# Patient Record
Sex: Female | Born: 1970 | Race: White | Hispanic: No | Marital: Married | Smoking: Former smoker
Health system: Southern US, Community
[De-identification: ages and names within clinical notes are randomized; demographics above are authoritative.]

## PROBLEM LIST (undated history)

## (undated) DIAGNOSIS — E785 Hyperlipidemia, unspecified: Secondary | ICD-10-CM

## (undated) DIAGNOSIS — F411 Generalized anxiety disorder: Secondary | ICD-10-CM

## (undated) DIAGNOSIS — K219 Gastro-esophageal reflux disease without esophagitis: Secondary | ICD-10-CM

## (undated) DIAGNOSIS — I10 Essential (primary) hypertension: Secondary | ICD-10-CM

## (undated) DIAGNOSIS — F410 Panic disorder [episodic paroxysmal anxiety] without agoraphobia: Secondary | ICD-10-CM

## (undated) HISTORY — PX: OTHER SURGICAL HISTORY: SHX169

## (undated) HISTORY — PX: TUBAL LIGATION: SHX77

## (undated) HISTORY — DX: Hyperlipidemia, unspecified: E78.5

## (undated) HISTORY — DX: Essential (primary) hypertension: I10

## (undated) HISTORY — DX: Gastro-esophageal reflux disease without esophagitis: K21.9

## (undated) HISTORY — DX: Generalized anxiety disorder: F41.1

## (undated) HISTORY — DX: Panic disorder (episodic paroxysmal anxiety): F41.0

---

## 2001-01-14 ENCOUNTER — Other Ambulatory Visit: Admission: RE | Admit: 2001-01-14 | Discharge: 2001-01-14 | Payer: Self-pay | Admitting: Obstetrics and Gynecology

## 2001-05-27 ENCOUNTER — Encounter: Payer: Self-pay | Admitting: Obstetrics and Gynecology

## 2001-05-27 ENCOUNTER — Inpatient Hospital Stay (HOSPITAL_COMMUNITY): Admission: AD | Admit: 2001-05-27 | Discharge: 2001-05-27 | Payer: Self-pay | Admitting: Obstetrics and Gynecology

## 2001-07-11 ENCOUNTER — Encounter (INDEPENDENT_AMBULATORY_CARE_PROVIDER_SITE_OTHER): Payer: Self-pay | Admitting: Specialist

## 2001-07-11 ENCOUNTER — Inpatient Hospital Stay (HOSPITAL_COMMUNITY): Admission: AD | Admit: 2001-07-11 | Discharge: 2001-07-13 | Payer: Self-pay | Admitting: Obstetrics and Gynecology

## 2001-07-14 ENCOUNTER — Encounter: Admission: RE | Admit: 2001-07-14 | Discharge: 2001-08-13 | Payer: Self-pay | Admitting: Obstetrics and Gynecology

## 2001-07-15 ENCOUNTER — Inpatient Hospital Stay (HOSPITAL_COMMUNITY): Admission: AD | Admit: 2001-07-15 | Discharge: 2001-07-15 | Payer: Self-pay | Admitting: Obstetrics & Gynecology

## 2001-08-18 ENCOUNTER — Other Ambulatory Visit: Admission: RE | Admit: 2001-08-18 | Discharge: 2001-08-18 | Payer: Self-pay | Admitting: Obstetrics and Gynecology

## 2004-09-09 ENCOUNTER — Other Ambulatory Visit: Admission: RE | Admit: 2004-09-09 | Discharge: 2004-09-09 | Payer: Self-pay | Admitting: Obstetrics and Gynecology

## 2005-09-11 ENCOUNTER — Other Ambulatory Visit: Admission: RE | Admit: 2005-09-11 | Discharge: 2005-09-11 | Payer: Self-pay | Admitting: Obstetrics and Gynecology

## 2006-08-11 ENCOUNTER — Other Ambulatory Visit: Admission: RE | Admit: 2006-08-11 | Discharge: 2006-08-11 | Payer: Self-pay | Admitting: Obstetrics and Gynecology

## 2007-08-16 ENCOUNTER — Other Ambulatory Visit: Admission: RE | Admit: 2007-08-16 | Discharge: 2007-08-16 | Payer: Self-pay | Admitting: Obstetrics and Gynecology

## 2008-08-30 ENCOUNTER — Other Ambulatory Visit: Admission: RE | Admit: 2008-08-30 | Discharge: 2008-08-30 | Payer: Self-pay | Admitting: Obstetrics and Gynecology

## 2009-08-31 ENCOUNTER — Other Ambulatory Visit: Admission: RE | Admit: 2009-08-31 | Discharge: 2009-08-31 | Payer: Self-pay | Admitting: Obstetrics and Gynecology

## 2010-09-23 ENCOUNTER — Other Ambulatory Visit: Admission: RE | Admit: 2010-09-23 | Discharge: 2010-09-23 | Payer: Self-pay | Admitting: Obstetrics and Gynecology

## 2011-01-16 ENCOUNTER — Other Ambulatory Visit: Payer: Self-pay | Admitting: Family Medicine

## 2011-01-16 ENCOUNTER — Ambulatory Visit
Admission: RE | Admit: 2011-01-16 | Discharge: 2011-01-16 | Disposition: A | Discharge status: Home / Self Care | Payer: BC Managed Care – PPO | Source: Ambulatory Visit | Attending: Family Medicine | Admitting: Family Medicine

## 2011-04-11 NOTE — Op Note (Signed)
[facility]  PatientMOLLYE, Kathryn Mitchell Visit Number: [id number] MRN: [id number]          Service Type: OBS Location: 910A 9110 01 Attending Physician:  Frederich Balding Proc. Date: 07/11/01 Adm. Date:  [id number]                             Operative Report  DELIVERY NOTE  HISTORY:                      The patient presented on August 18 with an intrauterine pregnancy at 35-1/2 weeks and spontaneous rupture of membranes. After discussion, we decided to proceed with induction in view of advanced gestational age and premature rupture of membranes with our concern being the risk of infection. We did not know her group B strep status. She was begun on IV penicillin G per protocol. With labor, the infant demonstrated deep variable decelerations. We felt this was probably due to decreased amniotic fluid. We therefore began an amnioinfusion. With this, we had an improvement in the fetal heart rate pattern. She eventually became completely dilated. With the second stage, recurrent decelerations occurred. The fetal vertex was at a +2 to +3 station in the ROA position. Due to the worsened decelerations, the decision was made to proceed with a vacuum assisted extraction delivery. We had discussed the risks of this with the parents including the risk of subcutaneous hemorrhage that could require transfusion, the risk of intracranial hemorrhage and its complications.  DESCRIPTION OF PROCEDURE:     The patient was in the dorsal lithotomy position. The Tender Touch vacuum extractor was put in place. With the next contraction, appropriate suction was undertaken. The fetal vertex was brought down to about crowning. We then removed it. We allowed the patient to begin pushing and the fetal vertex did not descend any further and we had a heart rate of this point and time in the 70s. The vacuum extractor was put back in place. With the next contraction, the fetal  vertex was brought beyond crowning, removed, and the patient went on to have a spontaneous vaginal delivery of viable female who weighed 4 pounds 5 ounces. Apgars were 8/8. The pH was 7.18. The team was in attendance due to prematurity. There was no episiotomy but there was a second-degree perineal tear that was repaired with 2-0 chromic. The placenta was delivered intact and sent to pathology. It did appear to be small and the infant appeared slightly growth retarded.  ANESTHESIA:                   Epidural.  ESTIMATED BLOOD LOSS:         800 cc.  DISPOSITION:                  Mother and baby were doing well in the postpartum period. DD:  07/11/01 TD:  07/12/01 Job: [id number]

## 2011-11-04 ENCOUNTER — Other Ambulatory Visit: Payer: Self-pay | Admitting: Obstetrics and Gynecology

## 2012-09-16 IMAGING — CT CT ABD-PELV W/O CM
3 of 4 series · 13 of 42 positions shown, 19 images · non-contrast
Comparison: None

***ADDENDUM*** CREATED: 02/10/2011 [DATE]

I reviewed the CT scan per Dr. Ezequi request.  Patient continues
with colicky pain.  The calcification in the left pelvis I
originally believed was just posterior to the left ureter but after
reviewing the study and confering with colleges believe that  it is
likely in the distal left ureter.
CLINICAL DATA: Hematuria and abdominal pain.
CT ABDOMEN AND PELVIS WITHOUT CONTRAST
TECHNIQUE: Multidetector CT imaging of the abdomen and pelvis was
performed following the standard protocol without intravenous
contrast.

[Series 2: renal stone w/o · axial · non-contrast · 0.81mm/px · z∈[-371,-36]mm · 8 of 87 slices shown, 13 images]
[im 10/87  soft-tissue]
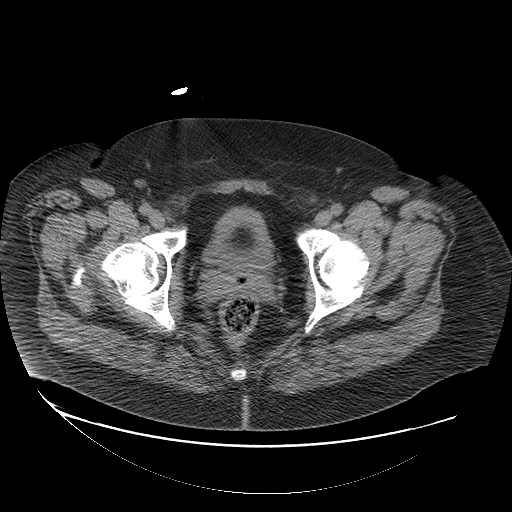
[im 10/87  bone]
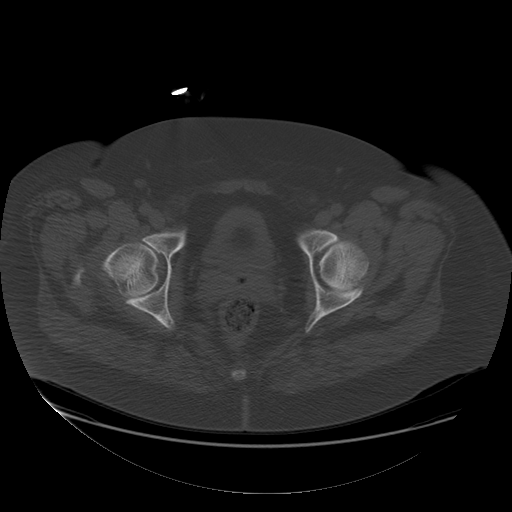
[im 20/87  soft-tissue]
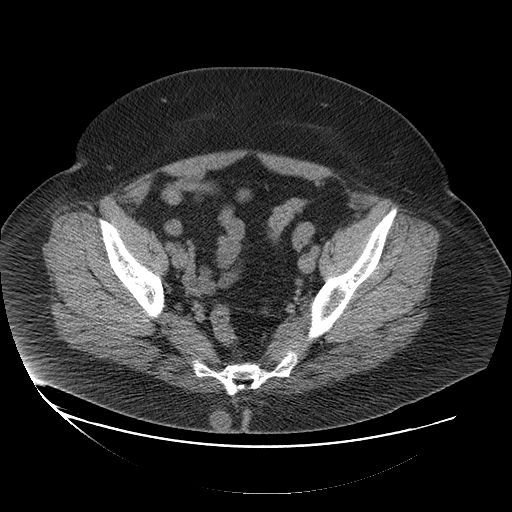
[im 29/87  soft-tissue]
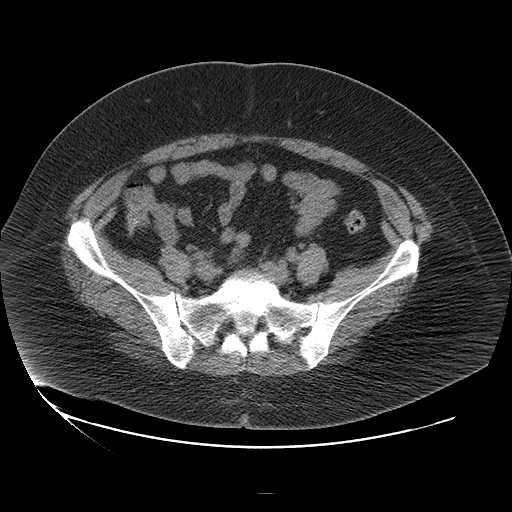
[im 39/87  soft-tissue]
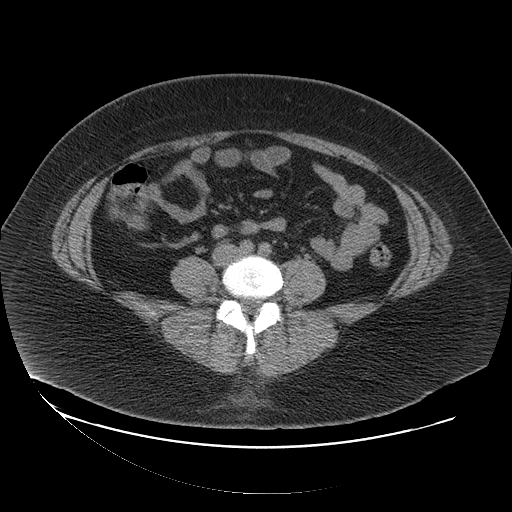
[im 48/87  soft-tissue]
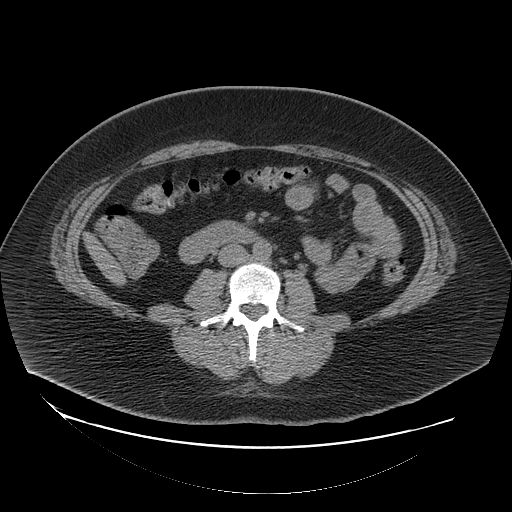
[im 48/87  lung]
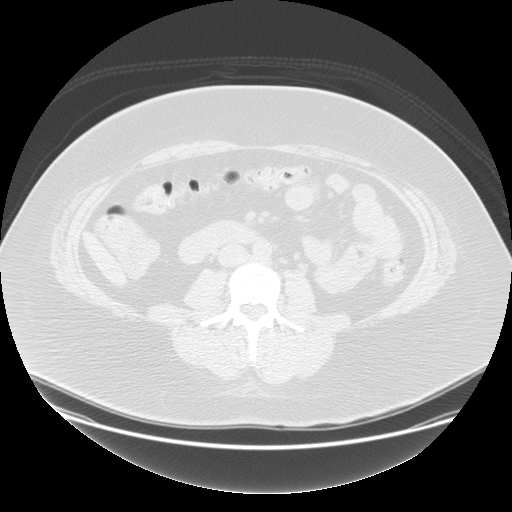
[im 58/87  soft-tissue]
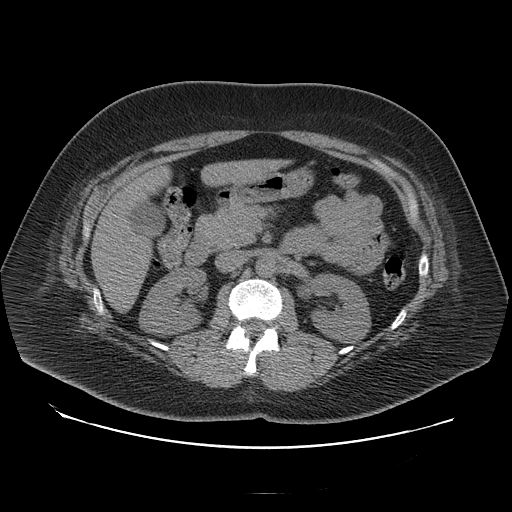
[im 58/87  lung]
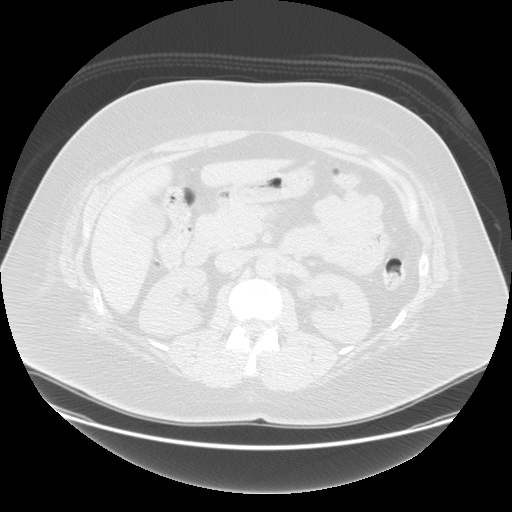
[im 67/87  soft-tissue]
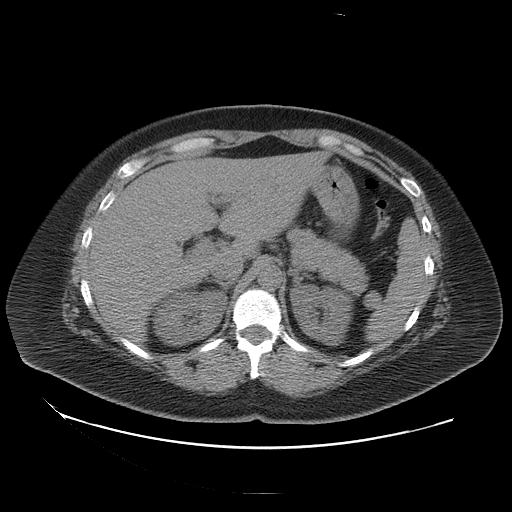
[im 67/87  lung]
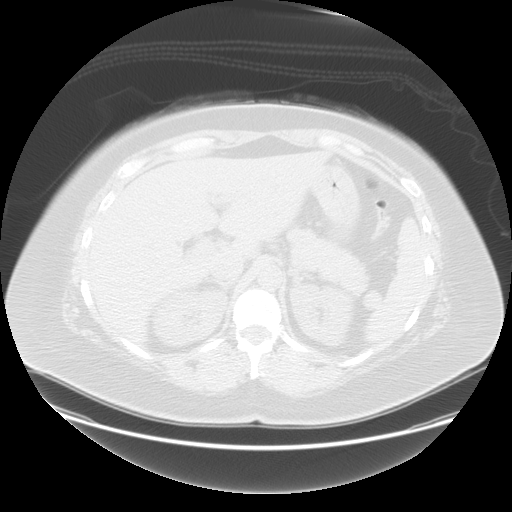
[im 77/87  soft-tissue]
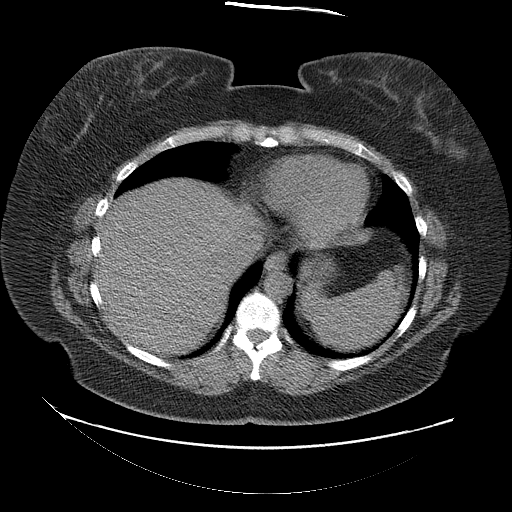
[im 77/87  lung]
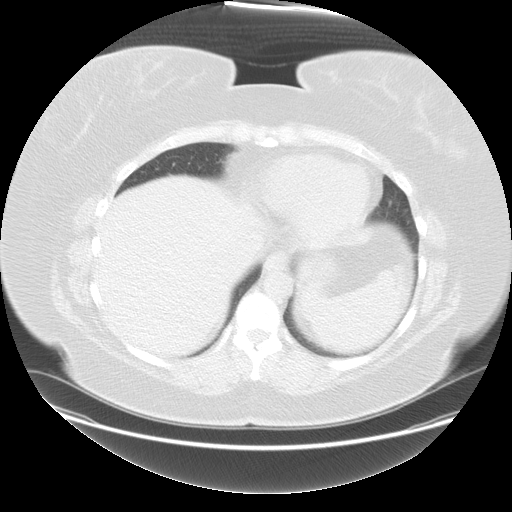

[Series 400: coronals · coronal · 0.90mm/px · 3 of 129 slices shown, 4 images]
[im 43/129  soft-tissue]
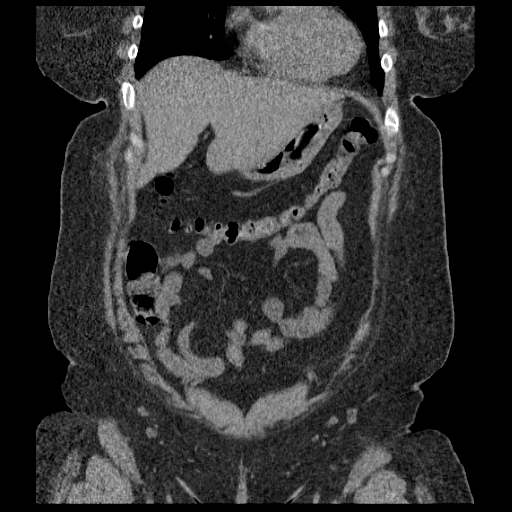
[im 57/129  soft-tissue]
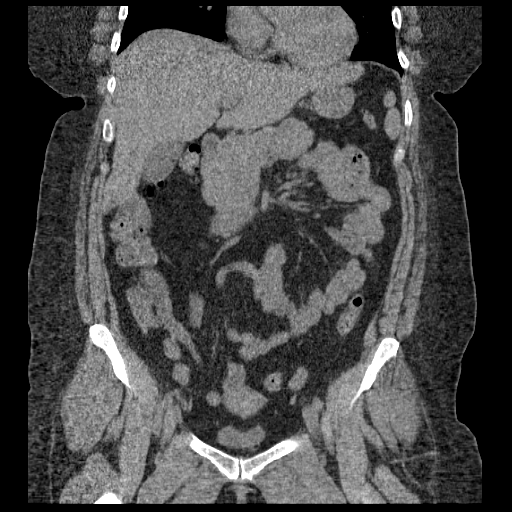
[im 57/129  bone]
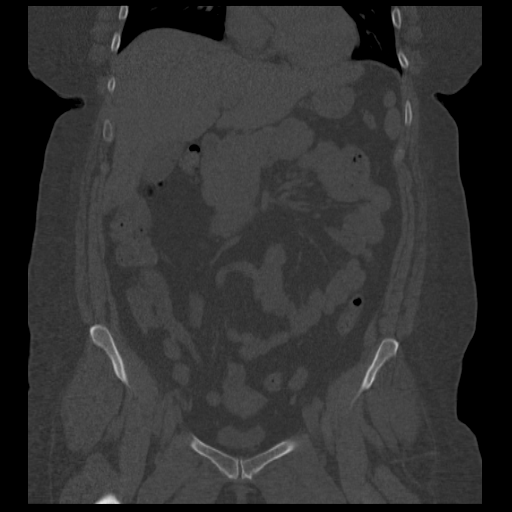
[im 72/129  soft-tissue]
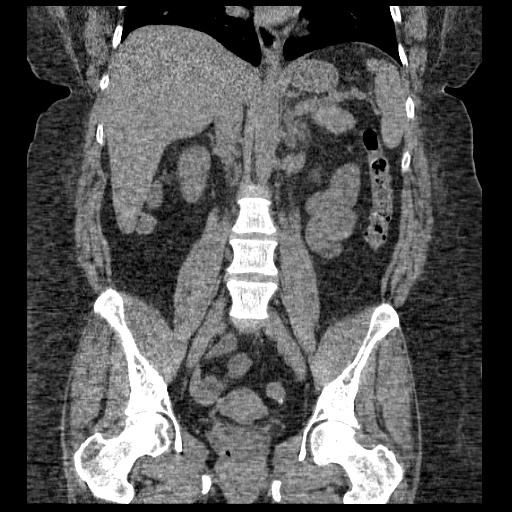

[Series 401: sagittals · sagittal · 0.90mm/px · 2 of 161 slices shown]
[im 18/161  soft-tissue]
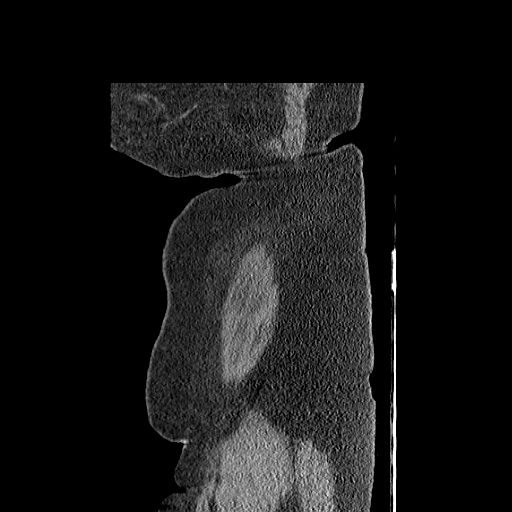
[im 36/161  soft-tissue]
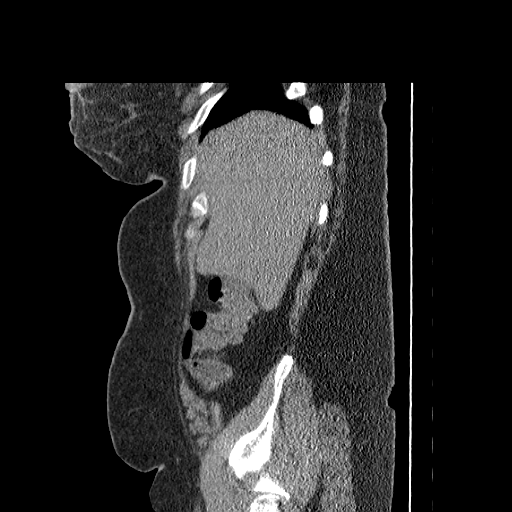

[13 of 42 positions shown; findings below may reference images not displayed]

FINDINGS: The lung bases are clear.  No pleural effusion.

The unenhanced appearance of the liver is unremarkable.  No focal
lesions or biliary dilatation.  The gallbladder appears normal.  No
common bile duct dilatation.  The pancreas is grossly normal.  The
spleen is normal in size.  No focal lesions.

The adrenal glands and kidneys are unremarkable.  No renal or
obstructing ureteral calculi.  No hydronephrosis or renal masses.
No perinephric process.

The stomach, duodenum, small bowel and colon demonstrate no
significant abnormalities.  No inflammatory changes or mass
lesions.  Scattered colonic diverticulosis is noted.  The appendix
is normal.  Small radiodensities are scattered in the colon and
likely represent ingested material. No mesenteric or
retroperitoneal masses or adenopathy.  The aorta is normal in
caliber.  No atherosclerotic changes.

The uterus and ovaries are normal.  The bladder is unremarkable.
No pelvic mass or adenopathy.  No free pelvic fluid collections.
No inguinal mass or hernia.  A sebaceous cyst is noted in the right
buttock area.

The bony pelvis is intact.  The lumbar vertebral bodies are
normally aligned.
IMPRESSION: No acute abdominal/pelvic findings, mass lesions or adenopathy.  No
renal or obstructing ureteral calculi.

## 2013-12-14 ENCOUNTER — Telehealth: Payer: Self-pay | Admitting: Nurse Practitioner

## 2013-12-14 ENCOUNTER — Encounter: Payer: Self-pay | Admitting: General Practice

## 2013-12-14 ENCOUNTER — Ambulatory Visit (INDEPENDENT_AMBULATORY_CARE_PROVIDER_SITE_OTHER): Payer: BC Managed Care – PPO | Admitting: General Practice

## 2013-12-14 VITALS — BP 171/94 | HR 74 | Temp 98.0°F | Ht 64.0 in | Wt 212.0 lb

## 2013-12-14 DIAGNOSIS — R05 Cough: Secondary | ICD-10-CM

## 2013-12-14 DIAGNOSIS — R059 Cough, unspecified: Secondary | ICD-10-CM

## 2013-12-14 DIAGNOSIS — J0100 Acute maxillary sinusitis, unspecified: Secondary | ICD-10-CM

## 2013-12-14 MED ORDER — BENZONATATE 100 MG PO CAPS: 100.0000 mg | ORAL_CAPSULE | Freq: Three times a day (TID) | ORAL | Status: DC | PRN

## 2013-12-14 MED ORDER — AZITHROMYCIN 250 MG PO TABS: ORAL_TABLET | ORAL | Status: DC

## 2013-12-14 NOTE — Telephone Encounter (Signed)
Appt given for today 

## 2013-12-14 NOTE — Progress Notes (Signed)
   Subjective:    Patient ID: Kathryn BraunPatti L Oertel, female    DOB: Oct 17, 1971, 43 y.o.   MRN: [id number]  Cough This is a new problem. The current episode started in the past 7 days. The problem has been gradually worsening. The problem occurs hourly. The cough is non-productive. Associated symptoms include postnasal drip. Pertinent negatives include no chest pain, chills, fever, headaches, myalgias, nasal congestion, sore throat, shortness of breath or wheezing. The symptoms are aggravated by lying down. She has tried OTC cough suppressant for the symptoms. There is no history of asthma, bronchitis or pneumonia.      Review of Systems  Constitutional: Negative for fever and chills.  HENT: Positive for postnasal drip and sinus pressure. Negative for sore throat.   Respiratory: Positive for cough. Negative for chest tightness, shortness of breath and wheezing.   Cardiovascular: Negative for chest pain and palpitations.  Musculoskeletal: Negative for myalgias.  Neurological: Negative for dizziness, weakness and headaches.       Objective:   Physical Exam  Constitutional: She is oriented to person, place, and time. She appears well-developed and well-nourished.  Cardiovascular: Normal rate, regular rhythm and normal heart sounds.   Pulmonary/Chest: Effort normal and breath sounds normal. No respiratory distress. She exhibits no tenderness.  Neurological: She is alert and oriented to person, place, and time.  Skin: Skin is warm and dry.  Psychiatric: She has a normal mood and affect.          Assessment & Plan:  1. Sinusitis, acute maxillary  - azithromycin (ZITHROMAX) 250 MG tablet; Take as directed  Dispense: 6 tablet; Refill: 0  2. Cough  - benzonatate (TESSALON) 100 MG capsule; Take 1 capsule (100 mg total) by mouth 3 (three) times daily as needed.  Dispense: 30 capsule; Refill: 0 -adequate fluids -RTO if symptoms worsen or unresolved -Patient verbalized understanding Coralie KeensMae E.  Zollie Ellery, FNP-C

## 2013-12-14 NOTE — Patient Instructions (Signed)

## 2014-01-04 ENCOUNTER — Ambulatory Visit (INDEPENDENT_AMBULATORY_CARE_PROVIDER_SITE_OTHER): Payer: Self-pay | Admitting: Surgery

## 2014-09-06 ENCOUNTER — Telehealth: Payer: Self-pay | Admitting: General Practice

## 2014-09-06 NOTE — Telephone Encounter (Signed)
Appt given per patient request 

## 2014-09-07 ENCOUNTER — Ambulatory Visit (INDEPENDENT_AMBULATORY_CARE_PROVIDER_SITE_OTHER): Payer: BC Managed Care – PPO | Admitting: Family Medicine

## 2014-09-07 ENCOUNTER — Encounter: Payer: Self-pay | Admitting: Family Medicine

## 2014-09-07 ENCOUNTER — Ambulatory Visit: Payer: BC Managed Care – PPO | Admitting: Family Medicine

## 2014-09-07 ENCOUNTER — Encounter (INDEPENDENT_AMBULATORY_CARE_PROVIDER_SITE_OTHER): Payer: Self-pay

## 2014-09-07 VITALS — BP 139/84 | HR 71 | Temp 97.9°F | Ht 64.0 in | Wt 211.2 lb

## 2014-09-07 DIAGNOSIS — K2100 Gastro-esophageal reflux disease with esophagitis, without bleeding: Secondary | ICD-10-CM

## 2014-09-07 DIAGNOSIS — R5383 Other fatigue: Secondary | ICD-10-CM

## 2014-09-07 DIAGNOSIS — R002 Palpitations: Secondary | ICD-10-CM

## 2014-09-07 DIAGNOSIS — R0602 Shortness of breath: Secondary | ICD-10-CM

## 2014-09-07 DIAGNOSIS — F411 Generalized anxiety disorder: Secondary | ICD-10-CM

## 2014-09-07 LAB — POCT CBC
Granulocyte percent: 66.8 %G (ref 37–80)
HCT, POC: 42.2 % (ref 37.7–47.9)
Hemoglobin: 13.8 g/dL (ref 12.2–16.2)
Lymph, poc: 2.4 (ref 0.6–3.4)
MCH, POC: 28.5 pg (ref 27–31.2)
MCHC: 32.6 g/dL (ref 31.8–35.4)
MCV: 87.5 fL (ref 80–97)
MPV: 7.4 fL (ref 0–99.8)
POC Granulocyte: 5.7 (ref 2–6.9)
POC LYMPH PERCENT: 28.6 %L (ref 10–50)
Platelet Count, POC: 278 10*3/uL (ref 142–424)
RBC: 4.8 M/uL (ref 4.04–5.48)
RDW, POC: 13.8 %
WBC: 8.5 10*3/uL (ref 4.6–10.2)

## 2014-09-07 MED ORDER — OMEPRAZOLE 20 MG PO CPDR: 20.0000 mg | DELAYED_RELEASE_CAPSULE | Freq: Every day | ORAL | Status: DC

## 2014-09-07 MED ORDER — SERTRALINE HCL 50 MG PO TABS: 50.0000 mg | ORAL_TABLET | Freq: Every day | ORAL | Status: DC

## 2014-09-07 NOTE — Progress Notes (Signed)
   Subjective:    Patient ID: Kathryn Mitchell, female    DOB: 09/08/1971, 43 y.o.   MRN: [id number]  HPI This 43 y.o. female presents for evaluation of sob and heart palpitation that last for 10 seconds then go away.  She is not currently having any episodes.  She had this happen a lot yesterday.  She drinks a lot of sun drops.   Review of Systems    No chest pain, SOB, HA, dizziness, vision change, N/V, diarrhea, constipation, dysuria, urinary urgency or frequency, myalgias, arthralgias or rash.  Objective:   Physical Exam  Vital signs noted  Well developed well nourished female.  HEENT - Head atraumatic Normocephalic                Eyes - PERRLA, Conjuctiva - clear Sclera- Clear EOMI                Ears - EAC's Wnl TM's Wnl Gross Hearing WNL                Nose - Nares patent                 Throat - oropharanx wnl Respiratory - Lungs CTA bilateral Cardiac - RRR S1 and S2 without murmur GI - Abdomen soft Nontender and bowel sounds active x 4 Extremities - No edema. Neuro - Grossly intact.  EKG - NSR w/o acute t wave changes     Assessment & Plan:  Palpitations - Plan: EKG 12-Lead, POCT CBC, CMP14+EGFR, Lipid panel, Thyroid Panel With TSH, Holter monitor - 24 hour, Holter monitor - 24 hour  SOB (shortness of breath) - Plan: POCT CBC  Other fatigue - Plan: POCT CBC, Thyroid Panel With TSH  Generalized anxiety disorder - Plan: sertraline (ZOLOFT) 50 MG tablet  Gastroesophageal reflux disease with esophagitis - Plan: omeprazole (PRILOSEC) 20 MG capsule  William J Oxford FNP 

## 2014-09-08 ENCOUNTER — Ambulatory Visit: Payer: BC Managed Care – PPO | Admitting: Family Medicine

## 2014-09-08 LAB — LIPID PANEL
Chol/HDL Ratio: 4.4 ratio units (ref 0.0–4.4)
Cholesterol, Total: 225 mg/dL — ABNORMAL HIGH (ref 100–199)
HDL: 51 mg/dL (ref >39)
LDL Calculated: 147 mg/dL — ABNORMAL HIGH (ref 0–99)
Triglycerides: 135 mg/dL (ref 0–149)
VLDL Cholesterol Cal: 27 mg/dL (ref 5–40)

## 2014-09-08 LAB — CMP14+EGFR
ALT: 17 IU/L (ref 0–32)
AST: 18 IU/L (ref 0–40)
Albumin/Globulin Ratio: 1.6 (ref 1.1–2.5)
Albumin: 4 g/dL (ref 3.5–5.5)
Alkaline Phosphatase: 105 IU/L (ref 39–117)
BUN/Creatinine Ratio: 9 (ref 9–23)
BUN: 7 mg/dL (ref 6–24)
CO2: 24 mmol/L (ref 18–29)
Calcium: 8.9 mg/dL (ref 8.7–10.2)
Chloride: 101 mmol/L (ref 97–108)
Creatinine, Ser: 0.75 mg/dL (ref 0.57–1.00)
GFR calc Af Amer: 112 mL/min/{1.73_m2} (ref >59)
GFR calc non Af Amer: 97 mL/min/{1.73_m2} (ref >59)
Globulin, Total: 2.5 g/dL (ref 1.5–4.5)
Glucose: 83 mg/dL (ref 65–99)
Potassium: 4.3 mmol/L (ref 3.5–5.2)
Sodium: 139 mmol/L (ref 134–144)
Total Bilirubin: 0.3 mg/dL (ref 0.0–1.2)
Total Protein: 6.5 g/dL (ref 6.0–8.5)

## 2014-09-08 LAB — THYROID PANEL WITH TSH
Free Thyroxine Index: 2.3 (ref 1.2–4.9)
T3 Uptake Ratio: 24 % (ref 24–39)
T4, Total: 9.7 ug/dL (ref 4.5–12.0)
TSH: 2.1 u[IU]/mL (ref 0.450–4.500)

## 2014-09-14 ENCOUNTER — Telehealth: Payer: Self-pay | Admitting: Family Medicine

## 2014-09-14 NOTE — Telephone Encounter (Signed)
Pt aware that results are still pending and we will give her a call when done

## 2014-09-15 ENCOUNTER — Other Ambulatory Visit: Payer: Self-pay | Admitting: Family Medicine

## 2014-09-15 MED ORDER — PRAVASTATIN SODIUM 20 MG PO TABS: 20.0000 mg | ORAL_TABLET | Freq: Every day | ORAL | Status: DC

## 2014-09-15 NOTE — Progress Notes (Signed)
Pt aware of labs & Rx is at pharmacy. Will call her when we get her holter results, they are still pending

## 2014-09-26 ENCOUNTER — Telehealth: Payer: Self-pay | Admitting: *Deleted

## 2014-09-26 NOTE — Telephone Encounter (Signed)
Pt notified of Holter results Verbalizes understanding

## 2014-09-27 NOTE — Telephone Encounter (Signed)
Results scanned into epic

## 2014-10-18 ENCOUNTER — Telehealth: Payer: Self-pay | Admitting: *Deleted

## 2014-10-18 MED ORDER — BENZONATATE 100 MG PO CAPS: 100.0000 mg | ORAL_CAPSULE | Freq: Three times a day (TID) | ORAL | Status: DC | PRN

## 2014-10-18 NOTE — Telephone Encounter (Signed)
Pt wants Tessalon pearles  For cough and sinus, she is unable to get appt with us and doesn't want to go to ER. [facility]

## 2014-10-25 ENCOUNTER — Other Ambulatory Visit: Payer: Self-pay | Admitting: *Deleted

## 2014-10-25 DIAGNOSIS — K2100 Gastro-esophageal reflux disease with esophagitis, without bleeding: Secondary | ICD-10-CM

## 2014-10-25 MED ORDER — OMEPRAZOLE 20 MG PO CPDR: 20.0000 mg | DELAYED_RELEASE_CAPSULE | Freq: Every day | ORAL | Status: DC

## 2014-12-19 ENCOUNTER — Ambulatory Visit (INDEPENDENT_AMBULATORY_CARE_PROVIDER_SITE_OTHER): Payer: BLUE CROSS/BLUE SHIELD | Admitting: Family Medicine

## 2014-12-19 ENCOUNTER — Encounter: Payer: Self-pay | Admitting: Family Medicine

## 2014-12-19 VITALS — BP 140/88 | HR 83 | Temp 98.6°F | Ht 64.0 in | Wt 219.0 lb

## 2014-12-19 DIAGNOSIS — J206 Acute bronchitis due to rhinovirus: Secondary | ICD-10-CM

## 2014-12-19 MED ORDER — FLUCONAZOLE 150 MG PO TABS: 150.0000 mg | ORAL_TABLET | Freq: Once | ORAL | Status: DC

## 2014-12-19 MED ORDER — AMOXICILLIN 875 MG PO TABS: 875.0000 mg | ORAL_TABLET | Freq: Two times a day (BID) | ORAL | Status: DC

## 2014-12-19 MED ORDER — METHYLPREDNISOLONE ACETATE 80 MG/ML IJ SUSP
80.0000 mg | Freq: Once | INTRAMUSCULAR | Status: AC
  Administered 2014-12-19: 80 mg via INTRAMUSCULAR

## 2014-12-19 MED ORDER — LIDOCAINE VISCOUS 2 % MT SOLN: 20.0000 mL | OROMUCOSAL | Status: DC | PRN

## 2014-12-19 MED ORDER — HYDROCODONE-HOMATROPINE 5-1.5 MG/5ML PO SYRP: 5.0000 mL | ORAL_SOLUTION | Freq: Three times a day (TID) | ORAL | Status: DC | PRN

## 2014-12-19 NOTE — Progress Notes (Signed)
   Subjective:    Patient ID: Kathryn Mitchell, female    DOB: [birth year], 44 y.o.   MRN: [id number]  HPI Patient is here for c/o severe cough  Review of Systems  Constitutional: Negative for fever.  HENT: Negative for ear pain.   Eyes: Negative for discharge.  Respiratory: Negative for cough.   Cardiovascular: Negative for chest pain.  Gastrointestinal: Negative for abdominal distention.  Endocrine: Negative for polyuria.  Genitourinary: Negative for difficulty urinating.  Musculoskeletal: Negative for gait problem and neck pain.  Skin: Negative for color change and rash.  Neurological: Negative for speech difficulty and headaches.  Psychiatric/Behavioral: Negative for agitation.       Objective:    BP 140/88 mmHg  Pulse 83  Temp(Src) 98.6 F (37 C) (Oral)  Ht 5\' 4"  (1.626 m)  Wt 219 lb (99.338 kg)  BMI 37.57 kg/m2 Physical Exam  Constitutional: She is oriented to person, place, and time. She appears well-developed and well-nourished.  HENT:  Head: Normocephalic and atraumatic.  Mouth/Throat: Oropharynx is clear and moist.  Eyes: Pupils are equal, round, and reactive to light.  Neck: Normal range of motion. Neck supple.  Cardiovascular: Normal rate and regular rhythm.   No murmur heard. Pulmonary/Chest: Effort normal and breath sounds normal.  Abdominal: Soft. Bowel sounds are normal. There is no tenderness.  Neurological: She is alert and oriented to person, place, and time.  Skin: Skin is warm and dry.  Psychiatric: She has a normal mood and affect.          Assessment & Plan:     ICD-9-CM ICD-10-CM   1. Acute bronchitis due to Rhinovirus 466.0 J20.6 fluconazole (DIFLUCAN) 150 MG tablet   079.3  HYDROcodone-homatropine (HYCODAN) 5-1.5 MG/5ML syrup     methylPREDNISolone acetate (DEPO-MEDROL) injection 80 mg   Push po fluids, rest, tylenol and motrin otc prn as directed for fever, arthralgias, and myalgias.  Follow up prn if sx's continue or persist.  No  Follow-up on file.  Deatra CanterWilliam J Oxford FNP

## 2014-12-19 NOTE — Addendum Note (Signed)
Addended by: Prescott GumLAND, Jamari Moten M on: 12/19/2014 05:31 PM   Modules accepted: Orders

## 2014-12-19 NOTE — Progress Notes (Signed)
   Subjective:    Patient ID: Kathryn Mitchell, female    DOB: [birth year], 44 y.o.   MRN: [id number]  HPI C/o sore throat.  She was tx'd with zpak last week and she states the pain became worse while taking the zpak.  She c/o cervical LAD.  Review of Systems  Constitutional: Negative for fever.  HENT: Negative for ear pain.   Eyes: Negative for discharge.  Respiratory: Negative for cough.   Cardiovascular: Negative for chest pain.  Gastrointestinal: Negative for abdominal distention.  Endocrine: Negative for polyuria.  Genitourinary: Negative for difficulty urinating.  Musculoskeletal: Negative for gait problem and neck pain.  Skin: Negative for color change and rash.  Neurological: Negative for speech difficulty and headaches.  Psychiatric/Behavioral: Negative for agitation.       Objective:    There were no vitals taken for this visit. Physical Exam  Constitutional: She is oriented to person, place, and time. She appears well-developed and well-nourished.  HENT:  Head: Normocephalic and atraumatic.  opx is injected   Eyes: Pupils are equal, round, and reactive to light.  Neck: Normal range of motion. Neck supple.  Cardiovascular: Normal rate and regular rhythm.   No murmur heard. Pulmonary/Chest: Effort normal and breath sounds normal.  Abdominal: Soft. Bowel sounds are normal. There is no tenderness.  Neurological: She is alert and oriented to person, place, and time.  Skin: Skin is warm and dry.  Psychiatric: She has a normal mood and affect.          Assessment & Plan:     ICD-9-CM ICD-10-CM   1. Acute pharyngitis due to other specified organisms 462 J02.8 POCT rapid strep A     amoxicillin (AMOXIL) 875 MG tablet     fluconazole (DIFLUCAN) 150 MG tablet     lidocaine (XYLOCAINE) 2 % solution     No Follow-up on file.  Deatra CanterWilliam J Khoury Siemon FNP

## 2014-12-19 NOTE — Addendum Note (Signed)
Addended by: Almeta MonasSTONE, Kenika Sahm M on: 12/19/2014 07:00 PM   Modules accepted: Orders

## 2014-12-20 ENCOUNTER — Telehealth: Payer: Self-pay | Admitting: *Deleted

## 2014-12-20 NOTE — Telephone Encounter (Signed)
Pharmacy was left a voice mail to cancel diflucan and amoxicillin which were sent in by W. Oxford. These were given to [facility] per patient request.

## 2014-12-25 ENCOUNTER — Telehealth: Payer: Self-pay | Admitting: *Deleted

## 2014-12-25 MED ORDER — ALBUTEROL SULFATE HFA 108 (90 BASE) MCG/ACT IN AERS: 1.0000 | INHALATION_SPRAY | RESPIRATORY_TRACT | Status: DC | PRN

## 2014-12-25 NOTE — Telephone Encounter (Signed)
Medication sent to pharmacy. Discussed with patient.  She will follow our suggestions and follow up in the office if symptoms worsen or persist.

## 2014-12-25 NOTE — Telephone Encounter (Signed)
Patient was seen on 12/19/14 for acute bronchitis. She is using hycodan but it isn't controlling her cough at night.  She doesn't have an inhaler.  Ander SladeBill Oxford, FNP treated her but he is out of the office today. Do you have any suggestions?

## 2014-12-25 NOTE — Telephone Encounter (Signed)
Please call a prescription in for albuterol inhaler 1 puff every 4-6 hours and have her pick up some Mucinex over-the-counter 1 twice daily with a large glass of water for cough and congestion. Also make sure she uses a cool mist humidifier and keep the house as cool as possible. If she is not better she needs to make an appointment for follow-up.

## 2014-12-26 ENCOUNTER — Encounter: Payer: Self-pay | Admitting: Family Medicine

## 2014-12-26 ENCOUNTER — Ambulatory Visit (INDEPENDENT_AMBULATORY_CARE_PROVIDER_SITE_OTHER): Payer: BLUE CROSS/BLUE SHIELD | Admitting: Family Medicine

## 2014-12-26 ENCOUNTER — Telehealth: Payer: Self-pay | Admitting: Family Medicine

## 2014-12-26 VITALS — BP 153/80 | HR 60 | Temp 97.5°F | Ht 64.0 in | Wt 219.0 lb

## 2014-12-26 DIAGNOSIS — J206 Acute bronchitis due to rhinovirus: Secondary | ICD-10-CM

## 2014-12-26 MED ORDER — PREDNISONE 10 MG PO TABS: ORAL_TABLET | ORAL | Status: DC

## 2014-12-26 MED ORDER — HYDROCODONE-HOMATROPINE 5-1.5 MG/5ML PO SYRP: 5.0000 mL | ORAL_SOLUTION | Freq: Three times a day (TID) | ORAL | Status: DC | PRN

## 2014-12-26 NOTE — Progress Notes (Signed)
   Subjective:    Patient ID: Kathryn Mitchell, female    DOB: 10/05/1971, 44 y.o.   MRN: [id number]  HPI C/o cough and uri sx's.  Review of Systems  Constitutional: Negative for fever.  HENT: Negative for ear pain.   Eyes: Negative for discharge.  Respiratory: Negative for cough.   Cardiovascular: Negative for chest pain.  Gastrointestinal: Negative for abdominal distention.  Endocrine: Negative for polyuria.  Genitourinary: Negative for difficulty urinating.  Musculoskeletal: Negative for gait problem and neck pain.  Skin: Negative for color change and rash.  Neurological: Negative for speech difficulty and headaches.  Psychiatric/Behavioral: Negative for agitation.       Objective:    BP 153/80 mmHg  Pulse 60  Temp(Src) 97.5 F (36.4 C) (Oral)  Ht 5\' 4"  (1.626 m)  Wt 219 lb (99.338 kg)  BMI 37.57 kg/m2 Physical Exam  Constitutional: She is oriented to person, place, and time. She appears well-developed and well-nourished.  HENT:  Head: Normocephalic and atraumatic.  Mouth/Throat: Oropharynx is clear and moist.  Eyes: Pupils are equal, round, and reactive to light.  Neck: Normal range of motion. Neck supple.  Cardiovascular: Normal rate and regular rhythm.   No murmur heard. Pulmonary/Chest: Effort normal and breath sounds normal.  Abdominal: Soft. Bowel sounds are normal. There is no tenderness.  Neurological: She is alert and oriented to person, place, and time.  Skin: Skin is warm and dry.  Psychiatric: She has a normal mood and affect.          Assessment & Plan:     ICD-9-CM ICD-10-CM   1. Acute bronchitis due to Rhinovirus 466.0 J20.6 predniSONE (DELTASONE) 10 MG tablet   079.3  HYDROcodone-homatropine (HYCODAN) 5-1.5 MG/5ML syrup     No Follow-up on file.  Deatra CanterWilliam J Oxford FNP

## 2014-12-27 NOTE — Telephone Encounter (Signed)
Patient seen in PM clinic on 12/26/2014

## 2015-01-19 ENCOUNTER — Other Ambulatory Visit: Payer: Self-pay | Admitting: Obstetrics and Gynecology

## 2015-01-22 LAB — CYTOLOGY - PAP

## 2015-03-23 ENCOUNTER — Other Ambulatory Visit: Payer: Self-pay | Admitting: Surgery

## 2015-03-26 NOTE — Progress Notes (Signed)
Quick Note:  Please contact patient and notify of benign pathology results.  Liane Tribbey M. Harles Evetts, MD, FACS [facility] Office: [phone]   ______ 

## 2015-09-11 ENCOUNTER — Other Ambulatory Visit: Payer: Self-pay | Admitting: Family Medicine

## 2015-09-14 ENCOUNTER — Other Ambulatory Visit: Payer: Self-pay | Admitting: *Deleted

## 2015-09-14 DIAGNOSIS — K2100 Gastro-esophageal reflux disease with esophagitis, without bleeding: Secondary | ICD-10-CM

## 2015-09-14 MED ORDER — OMEPRAZOLE 20 MG PO CPDR: 20.0000 mg | DELAYED_RELEASE_CAPSULE | Freq: Every day | ORAL | Status: DC

## 2015-10-17 ENCOUNTER — Ambulatory Visit (INDEPENDENT_AMBULATORY_CARE_PROVIDER_SITE_OTHER): Payer: BLUE CROSS/BLUE SHIELD | Admitting: Family

## 2015-10-17 ENCOUNTER — Encounter: Payer: Self-pay | Admitting: Family

## 2015-10-17 DIAGNOSIS — K2100 Gastro-esophageal reflux disease with esophagitis, without bleeding: Secondary | ICD-10-CM

## 2015-10-17 DIAGNOSIS — K219 Gastro-esophageal reflux disease without esophagitis: Secondary | ICD-10-CM | POA: Insufficient documentation

## 2015-10-17 MED ORDER — OMEPRAZOLE 20 MG PO CPDR: 20.0000 mg | DELAYED_RELEASE_CAPSULE | Freq: Every day | ORAL | Status: DC

## 2015-10-17 NOTE — Patient Instructions (Signed)
Health Maintenance, Female Adopting a healthy lifestyle and getting preventive care can go a long way to promote health and wellness. Talk with your health care provider about what schedule of regular examinations is right for you. This is a good chance for you to check in with your provider about disease prevention and staying healthy. In between checkups, there are plenty of things you can do on your own. Experts have done a lot of research about which lifestyle changes and preventive measures are most likely to keep you healthy. Ask your health care provider for more information. WEIGHT AND DIET  Eat a healthy diet  Be sure to include plenty of vegetables, fruits, low-fat dairy products, and lean protein.  Do not eat a lot of foods high in solid fats, added sugars, or salt.  Get regular exercise. This is one of the most important things you can do for your health.  Most adults should exercise for at least 150 minutes each week. The exercise should increase your heart rate and make you sweat (moderate-intensity exercise).  Most adults should also do strengthening exercises at least twice a week. This is in addition to the moderate-intensity exercise.  Maintain a healthy weight  Body mass index (BMI) is a measurement that can be used to identify possible weight problems. It estimates body fat based on height and weight. Your health care provider can help determine your BMI and help you achieve or maintain a healthy weight.  For females 20 years of age and older:   A BMI below 18.5 is considered underweight.  A BMI of 18.5 to 24.9 is normal.  A BMI of 25 to 29.9 is considered overweight.  A BMI of 30 and above is considered obese.  Watch levels of cholesterol and blood lipids  You should start having your blood tested for lipids and cholesterol at 45 years of age, then have this test every 5 years.  You may need to have your cholesterol levels checked more often if:  Your lipid  or cholesterol levels are high.  You are older than 45 years of age.  You are at high risk for heart disease.  CANCER SCREENING   Lung Cancer  Lung cancer screening is recommended for adults 55-80 years old who are at high risk for lung cancer because of a history of smoking.  A yearly low-dose CT scan of the lungs is recommended for people who:  Currently smoke.  Have quit within the past 15 years.  Have at least a 30-pack-year history of smoking. A pack year is smoking an average of one pack of cigarettes a day for 1 year.  Yearly screening should continue until it has been 15 years since you quit.  Yearly screening should stop if you develop a health problem that would prevent you from having lung cancer treatment.  Breast Cancer  Practice breast self-awareness. This means understanding how your breasts normally appear and feel.  It also means doing regular breast self-exams. Let your health care provider know about any changes, no matter how small.  If you are in your 20s or 30s, you should have a clinical breast exam (CBE) by a health care provider every 1-3 years as part of a regular health exam.  If you are 40 or older, have a CBE every year. Also consider having a breast X-ray (mammogram) every year.  If you have a family history of breast cancer, talk to your health care provider about genetic screening.  If you   are at high risk for breast cancer, talk to your health care provider about having an MRI and a mammogram every year.  Breast cancer gene (BRCA) assessment is recommended for women who have family members with BRCA-related cancers. BRCA-related cancers include:  Breast.  Ovarian.  Tubal.  Peritoneal cancers.  Results of the assessment will determine the need for genetic counseling and BRCA1 and BRCA2 testing. Cervical Cancer Your health care provider may recommend that you be screened regularly for cancer of the pelvic organs (ovaries, uterus, and  vagina). This screening involves a pelvic examination, including checking for microscopic changes to the surface of your cervix (Pap test). You may be encouraged to have this screening done every 3 years, beginning at age 21.  For women ages 30-65, health care providers may recommend pelvic exams and Pap testing every 3 years, or they may recommend the Pap and pelvic exam, combined with testing for human papilloma virus (HPV), every 5 years. Some types of HPV increase your risk of cervical cancer. Testing for HPV may also be done on women of any age with unclear Pap test results.  Other health care providers may not recommend any screening for nonpregnant women who are considered low risk for pelvic cancer and who do not have symptoms. Ask your health care provider if a screening pelvic exam is right for you.  If you have had past treatment for cervical cancer or a condition that could lead to cancer, you need Pap tests and screening for cancer for at least 20 years after your treatment. If Pap tests have been discontinued, your risk factors (such as having a new sexual partner) need to be reassessed to determine if screening should resume. Some women have medical problems that increase the chance of getting cervical cancer. In these cases, your health care provider may recommend more frequent screening and Pap tests. Colorectal Cancer  This type of cancer can be detected and often prevented.  Routine colorectal cancer screening usually begins at 45 years of age and continues through 45 years of age.  Your health care provider may recommend screening at an earlier age if you have risk factors for colon cancer.  Your health care provider may also recommend using home test kits to check for hidden blood in the stool.  A small camera at the end of a tube can be used to examine your colon directly (sigmoidoscopy or colonoscopy). This is done to check for the earliest forms of colorectal  cancer.  Routine screening usually begins at age 50.  Direct examination of the colon should be repeated every 5-10 years through 45 years of age. However, you may need to be screened more often if early forms of precancerous polyps or small growths are found. Skin Cancer  Check your skin from head to toe regularly.  Tell your health care provider about any new moles or changes in moles, especially if there is a change in a mole's shape or color.  Also tell your health care provider if you have a mole that is larger than the size of a pencil eraser.  Always use sunscreen. Apply sunscreen liberally and repeatedly throughout the day.  Protect yourself by wearing long sleeves, pants, a wide-brimmed hat, and sunglasses whenever you are outside. HEART DISEASE, DIABETES, AND HIGH BLOOD PRESSURE   High blood pressure causes heart disease and increases the risk of stroke. High blood pressure is more likely to develop in:  People who have blood pressure in the high end   of the normal range (130-139/85-89 mm Hg).  People who are overweight or obese.  People who are African American.  If you are 38-23 years of age, have your blood pressure checked every 3-5 years. If you are 61 years of age or older, have your blood pressure checked every year. You should have your blood pressure measured twice--once when you are at a hospital or clinic, and once when you are not at a hospital or clinic. Record the average of the two measurements. To check your blood pressure when you are not at a hospital or clinic, you can use:  An automated blood pressure machine at a pharmacy.  A home blood pressure monitor.  If you are between 45 years and 39 years old, ask your health care provider if you should take aspirin to prevent strokes.  Have regular diabetes screenings. This involves taking a blood sample to check your fasting blood sugar level.  If you are at a normal weight and have a low risk for diabetes,  have this test once every three years after 45 years of age.  If you are overweight and have a high risk for diabetes, consider being tested at a younger age or more often. PREVENTING INFECTION  Hepatitis B  If you have a higher risk for hepatitis B, you should be screened for this virus. You are considered at high risk for hepatitis B if:  You were born in a country where hepatitis B is common. Ask your health care provider which countries are considered high risk.  Your parents were born in a high-risk country, and you have not been immunized against hepatitis B (hepatitis B vaccine).  You have HIV or AIDS.  You use needles to inject street drugs.  You live with someone who has hepatitis B.  You have had sex with someone who has hepatitis B.  You get hemodialysis treatment.  You take certain medicines for conditions, including cancer, organ transplantation, and autoimmune conditions. Hepatitis C  Blood testing is recommended for:  Everyone born from [birth year] through 1966.  Anyone with known risk factors for hepatitis C. Sexually transmitted infections (STIs)  You should be screened for sexually transmitted infections (STIs) including gonorrhea and chlamydia if:  You are sexually active and are younger than 45 years of age.  You are older than 45 years of age and your health care provider tells you that you are at risk for this type of infection.  Your sexual activity has changed since you were last screened and you are at an increased risk for chlamydia or gonorrhea. Ask your health care provider if you are at risk.  If you do not have HIV, but are at risk, it may be recommended that you take a prescription medicine daily to prevent HIV infection. This is called pre-exposure prophylaxis (PrEP). You are considered at risk if:  You are sexually active and do not regularly use condoms or know the HIV status of your partner(s).  You take drugs by injection.  You are sexually  active with a partner who has HIV. Talk with your health care provider about whether you are at high risk of being infected with HIV. If you choose to begin PrEP, you should first be tested for HIV. You should then be tested every 3 months for as long as you are taking PrEP.  PREGNANCY   If you are premenopausal and you may become pregnant, ask your health care provider about preconception counseling.  If you may  become pregnant, take 400 to 800 micrograms (mcg) of folic acid every day.  If you want to prevent pregnancy, talk to your health care provider about birth control (contraception). OSTEOPOROSIS AND MENOPAUSE   Osteoporosis is a disease in which the bones lose minerals and strength with aging. This can result in serious bone fractures. Your risk for osteoporosis can be identified using a bone density scan.  If you are 61 years of age or older, or if you are at risk for osteoporosis and fractures, ask your health care provider if you should be screened.  Ask your health care provider whether you should take a calcium or vitamin D supplement to lower your risk for osteoporosis.  Menopause may have certain physical symptoms and risks.  Hormone replacement therapy may reduce some of these symptoms and risks. Talk to your health care provider about whether hormone replacement therapy is right for you.  HOME CARE INSTRUCTIONS   Schedule regular health, dental, and eye exams.  Stay current with your immunizations.   Do not use any tobacco products including cigarettes, chewing tobacco, or electronic cigarettes.  If you are pregnant, do not drink alcohol.  If you are breastfeeding, limit how much and how often you drink alcohol.  Limit alcohol intake to no more than 1 drink per day for nonpregnant women. One drink equals 12 ounces of beer, 5 ounces of wine, or 1 ounces of hard liquor.  Do not use street drugs.  Do not share needles.  Ask your health care provider for help if  you need support or information about quitting drugs.  Tell your health care provider if you often feel depressed.  Tell your health care provider if you have ever been abused or do not feel safe at home.   This information is not intended to replace advice given to you by your health care provider. Make sure you discuss any questions you have with your health care provider.   Document Released: 05/26/2011 Document Revised: 12/01/2014 Document Reviewed: 10/12/2013 Elsevier Interactive Patient Education [employer].

## 2015-10-17 NOTE — Progress Notes (Signed)
   Subjective:    Patient ID: Kathryn Mitchell, female    DOB: 05-Jan-1971, 44 y.o.   MRN: [id number]  Pt presents to the office today for chronic follow up. PT states she just had blood work completed at work and her cholesterol, blood pressure and glucose were normal.  Gastroesophageal Reflux She reports no belching, no coughing, no heartburn or no water brash. This is a chronic problem. The current episode started more than 1 year ago. The problem occurs constantly. The symptoms are aggravated by certain foods. Risk factors include obesity. She has tried a PPI for the symptoms. The treatment provided significant relief.      Review of Systems  Constitutional: Negative.   HENT: Negative.   Eyes: Negative.   Respiratory: Negative.  Negative for cough and shortness of breath.   Cardiovascular: Negative.  Negative for palpitations.  Gastrointestinal: Negative.  Negative for heartburn.  Endocrine: Negative.   Genitourinary: Negative.   Musculoskeletal: Negative.   Neurological: Negative.  Negative for headaches.  Hematological: Negative.   Psychiatric/Behavioral: Negative.   All other systems reviewed and are negative.      Objective:   Physical Exam  Constitutional: She is oriented to person, place, and time. She appears well-developed and well-nourished. No distress.  HENT:  Head: Normocephalic and atraumatic.  Right Ear: External ear normal.  Left Ear: External ear normal.  Nose: Nose normal.  Mouth/Throat: Oropharynx is clear and moist.  Eyes: Pupils are equal, round, and reactive to light.  Neck: Normal range of motion. Neck supple. No thyromegaly present.  Cardiovascular: Normal rate, regular rhythm, normal heart sounds and intact distal pulses.   No murmur heard. Pulmonary/Chest: Effort normal and breath sounds normal. No respiratory distress. She has no wheezes.  Abdominal: Soft. Bowel sounds are normal. She exhibits no distension. There is no tenderness.    Musculoskeletal: Normal range of motion. She exhibits no edema or tenderness.  Neurological: She is alert and oriented to person, place, and time. She has normal reflexes. No cranial nerve deficit.  Skin: Skin is warm and dry.  Psychiatric: She has a normal mood and affect. Her behavior is normal. Judgment and thought content normal.  Vitals reviewed.     BP 140/94 mmHg  Pulse 85  Temp(Src) 97.9 F (36.6 C) (Oral)  Ht $R'5\' 4"'vu$  (1.626 m)  Wt 209 lb 6.4 oz (94.983 kg)  BMI 35.93 kg/m2     Assessment & Plan:  1. Gastroesophageal reflux disease with esophagitis -Weight loss encouraged - CMP14+EGFR - omeprazole (PRILOSEC) 20 MG capsule; Take 1 capsule (20 mg total) by mouth daily.  Dispense: 90 capsule; Refill: 0  Discussed if BP continues to be elevated will need to start medication. At work BP was WNL.  Continue all meds Labs pending Health Maintenance reviewed Diet and exercise encouraged RTO 1 year  Evelina Dun, FNP

## 2015-10-18 LAB — CMP14+EGFR
A/G RATIO: 1.6 (ref 1.1–2.5)
ALK PHOS: 104 IU/L (ref 39–117)
ALT: 12 IU/L (ref 0–32)
AST: 16 IU/L (ref 0–40)
Albumin: 3.9 g/dL (ref 3.5–5.5)
BUN/Creatinine Ratio: 10 (ref 9–23)
BUN: 8 mg/dL (ref 6–24)
Bilirubin Total: 0.2 mg/dL (ref 0.0–1.2)
CO2: 26 mmol/L (ref 18–29)
Calcium: 9.5 mg/dL (ref 8.7–10.2)
Chloride: 101 mmol/L (ref 97–106)
Creatinine, Ser: 0.8 mg/dL (ref 0.57–1.00)
GFR calc Af Amer: 103 mL/min/{1.73_m2} (ref >59)
GFR calc non Af Amer: 89 mL/min/{1.73_m2} (ref >59)
GLOBULIN, TOTAL: 2.5 g/dL (ref 1.5–4.5)
Glucose: 91 mg/dL (ref 65–99)
POTASSIUM: 4.1 mmol/L (ref 3.5–5.2)
SODIUM: 142 mmol/L (ref 136–144)
Total Protein: 6.4 g/dL (ref 6.0–8.5)

## 2015-10-25 ENCOUNTER — Ambulatory Visit (INDEPENDENT_AMBULATORY_CARE_PROVIDER_SITE_OTHER): Payer: BLUE CROSS/BLUE SHIELD | Admitting: Family

## 2015-10-25 ENCOUNTER — Encounter: Payer: Self-pay | Admitting: Family

## 2015-10-25 VITALS — BP 158/95 | HR 78 | Temp 97.4°F | Ht 64.0 in | Wt 209.0 lb

## 2015-10-25 DIAGNOSIS — J309 Allergic rhinitis, unspecified: Secondary | ICD-10-CM | POA: Diagnosis not present

## 2015-10-25 DIAGNOSIS — I10 Essential (primary) hypertension: Secondary | ICD-10-CM | POA: Diagnosis not present

## 2015-10-25 MED ORDER — MOMETASONE FUROATE 50 MCG/ACT NA SUSP: 2.0000 | Freq: Every day | NASAL | Status: DC

## 2015-10-25 MED ORDER — HYDROCHLOROTHIAZIDE 25 MG PO TABS: 25.0000 mg | ORAL_TABLET | Freq: Every day | ORAL | Status: DC

## 2015-10-25 NOTE — Patient Instructions (Signed)
DASH Eating Plan °DASH stands for "Dietary Approaches to Stop Hypertension." The DASH eating plan is a healthy eating plan that has been shown to reduce high blood pressure (hypertension). Additional health benefits may include reducing the risk of type 2 diabetes mellitus, heart disease, and stroke. The DASH eating plan may also help with weight loss. °WHAT DO I NEED TO KNOW ABOUT THE DASH EATING PLAN? °For the DASH eating plan, you will follow these general guidelines: °· Choose foods with a percent daily value for sodium of less than 5% (as listed on the food label). °· Use salt-free seasonings or herbs instead of table salt or sea salt. °· Check with your health care provider or pharmacist before using salt substitutes. °· Eat lower-sodium products, often labeled as "lower sodium" or "no salt added." °· Eat fresh foods. °· Eat more vegetables, fruits, and low-fat dairy products. °· Choose whole grains. Look for the word "whole" as the first word in the ingredient list. °· Choose fish and skinless chicken or turkey more often than red meat. Limit fish, poultry, and meat to 6 oz (170 g) each day. °· Limit sweets, desserts, sugars, and sugary drinks. °· Choose heart-healthy fats. °· Limit cheese to 1 oz (28 g) per day. °· Eat more home-cooked food and less restaurant, buffet, and fast food. °· Limit fried foods. °· Cook foods using methods other than frying. °· Limit canned vegetables. If you do use them, rinse them well to decrease the sodium. °· When eating at a restaurant, ask that your food be prepared with less salt, or no salt if possible. °WHAT FOODS CAN I EAT? °Seek help from a dietitian for individual calorie needs. °Grains °Whole grain or whole wheat bread. Brown rice. Whole grain or whole wheat pasta. Quinoa, bulgur, and whole grain cereals. Low-sodium cereals. Corn or whole wheat flour tortillas. Whole grain cornbread. Whole grain crackers. Low-sodium crackers. °Vegetables °Fresh or frozen vegetables  (raw, steamed, roasted, or grilled). Low-sodium or reduced-sodium tomato and vegetable juices. Low-sodium or reduced-sodium tomato sauce and paste. Low-sodium or reduced-sodium canned vegetables.  °Fruits °All fresh, canned (in natural juice), or frozen fruits. °Meat and Other Protein Products °Ground beef (85% or leaner), grass-fed beef, or beef trimmed of fat. Skinless chicken or turkey. Ground chicken or turkey. Pork trimmed of fat. All fish and seafood. Eggs. Dried beans, peas, or lentils. Unsalted nuts and seeds. Unsalted canned beans. °Dairy °Low-fat dairy products, such as skim or 1% milk, 2% or reduced-fat cheeses, low-fat ricotta or cottage cheese, or plain low-fat yogurt. Low-sodium or reduced-sodium cheeses. °Fats and Oils °Tub margarines without trans fats. Light or reduced-fat mayonnaise and salad dressings (reduced sodium). Avocado. Safflower, olive, or canola oils. Natural peanut or almond butter. °Other °Unsalted popcorn and pretzels. °The items listed above may not be a complete list of recommended foods or beverages. Contact your dietitian for more options. °WHAT FOODS ARE NOT RECOMMENDED? °Grains °White bread. White pasta. White rice. Refined cornbread. Bagels and croissants. Crackers that contain trans fat. °Vegetables °Creamed or fried vegetables. Vegetables in a cheese sauce. Regular canned vegetables. Regular canned tomato sauce and paste. Regular tomato and vegetable juices. °Fruits °Dried fruits. Canned fruit in light or heavy syrup. Fruit juice. °Meat and Other Protein Products °Fatty cuts of meat. Ribs, chicken wings, bacon, sausage, bologna, salami, chitterlings, fatback, hot dogs, bratwurst, and packaged luncheon meats. Salted nuts and seeds. Canned beans with salt. °Dairy °Whole or 2% milk, cream, half-and-half, and cream cheese. Whole-fat or sweetened yogurt. Full-fat   cheeses or blue cheese. Nondairy creamers and whipped toppings. Processed cheese, cheese spreads, or cheese  curds. °Condiments °Onion and garlic salt, seasoned salt, table salt, and sea salt. Canned and packaged gravies. Worcestershire sauce. Tartar sauce. Barbecue sauce. Teriyaki sauce. Soy sauce, including reduced sodium. Steak sauce. Fish sauce. Oyster sauce. Cocktail sauce. Horseradish. Ketchup and mustard. Meat flavorings and tenderizers. Bouillon cubes. Hot sauce. Tabasco sauce. Marinades. Taco seasonings. Relishes. °Fats and Oils °Butter, stick margarine, lard, shortening, ghee, and bacon fat. Coconut, palm kernel, or palm oils. Regular salad dressings. °Other °Pickles and olives. Salted popcorn and pretzels. °The items listed above may not be a complete list of foods and beverages to avoid. Contact your dietitian for more information. °WHERE CAN I FIND MORE INFORMATION? °[employer], [employer]: www.nhlbi.nih.gov/health/health-topics/topics/dash/ °  °This information is not intended to replace advice given to you by your health care provider. Make sure you discuss any questions you have with your health care provider. °  °Document Released: 10/30/2011 Document Revised: 12/01/2014 Document Reviewed: 09/14/2013 °Elsevier Interactive Patient Education ©2016 [employer] Inc[employer] ° °Hypertension °Hypertension, commonly called high blood pressure, is when the force of blood pumping through your arteries is too strong. Your arteries are the blood vessels that carry blood from your heart throughout your body. A blood pressure reading consists of a higher number over a lower number, such as 110/72. The higher number (systolic) is the pressure inside your arteries when your heart pumps. The lower number (diastolic) is the pressure inside your arteries when your heart relaxes. Ideally you want your blood pressure below 120/80. °Hypertension forces your heart to work harder to pump blood. Your arteries may become narrow or stiff. Having untreated or uncontrolled hypertension can cause heart attack, stroke, kidney  disease, and other problems. °RISK FACTORS °Some risk factors for high blood pressure are controllable. Others are not.  °Risk factors you cannot control include:  °· Race. You may be at higher risk if you are African American. °· Age. Risk increases with age. °· Gender. Men are at higher risk than women before age 45 years. After age 65, women are at higher risk than men. °Risk factors you can control include: °· Not getting enough exercise or physical activity. °· Being overweight. °· Getting too much fat, sugar, calories, or salt in your diet. °· Drinking too much alcohol. °SIGNS AND SYMPTOMS °Hypertension does not usually cause signs or symptoms. Extremely high blood pressure (hypertensive crisis) may cause headache, anxiety, shortness of breath, and nosebleed. °DIAGNOSIS °To check if you have hypertension, your health care provider will measure your blood pressure while you are seated, with your arm held at the level of your heart. It should be measured at least twice using the same arm. Certain conditions can cause a difference in blood pressure between your right and left arms. A blood pressure reading that is higher than normal on one occasion does not mean that you need treatment. If it is not clear whether you have high blood pressure, you may be asked to return on a different day to have your blood pressure checked again. Or, you may be asked to monitor your blood pressure at home for 1 or more weeks. °TREATMENT °Treating high blood pressure includes making lifestyle changes and possibly taking medicine. Living a healthy lifestyle can help lower high blood pressure. You may need to change some of your habits. °Lifestyle changes may include: °· Following the DASH diet. This diet is high in fruits, vegetables, and whole   grains. It is low in salt, red meat, and added sugars. °· Keep your sodium intake below 2,300 mg per day. °· Getting at least 30-45 minutes of aerobic exercise at least 4 times per  week. °· Losing weight if necessary. °· Not smoking. °· Limiting alcoholic beverages. °· Learning ways to reduce stress. °Your health care provider may prescribe medicine if lifestyle changes are not enough to get your blood pressure under control, and if one of the following is true: °· You are 18-59 years of age and your systolic blood pressure is above 140. °· You are 60 years of age or older, and your systolic blood pressure is above 150. °· Your diastolic blood pressure is above 90. °· You have diabetes, and your systolic blood pressure is over 140 or your diastolic blood pressure is over 90. °· You have kidney disease and your blood pressure is above 140/90. °· You have heart disease and your blood pressure is above 140/90. °Your personal target blood pressure may vary depending on your medical conditions, your age, and other factors. °HOME CARE INSTRUCTIONS °· Have your blood pressure rechecked as directed by your health care provider.   °· Take medicines only as directed by your health care provider. Follow the directions carefully. Blood pressure medicines must be taken as prescribed. The medicine does not work as well when you skip doses. Skipping doses also puts you at risk for problems. °· Do not smoke.   °· Monitor your blood pressure at home as directed by your health care provider.  °SEEK MEDICAL CARE IF:  °· You think you are having a reaction to medicines taken. °· You have recurrent headaches or feel dizzy. °· You have swelling in your ankles. °· You have trouble with your vision. °SEEK IMMEDIATE MEDICAL CARE IF: °· You develop a severe headache or confusion. °· You have unusual weakness, numbness, or feel faint. °· You have severe chest or abdominal pain. °· You vomit repeatedly. °· You have trouble breathing. °MAKE SURE YOU:  °· Understand these instructions. °· Will watch your condition. °· Will get help right away if you are not doing well or get worse. °  °This information is not intended to  replace advice given to you by your health care provider. Make sure you discuss any questions you have with your health care provider. °  °Document Released: 11/10/2005 Document Revised: 03/27/2015 Document Reviewed: 09/02/2013 °Elsevier Interactive Patient Education ©2016 Elsevier Inc. ° °

## 2015-10-25 NOTE — Progress Notes (Signed)
   Subjective:    Patient ID: Kathryn BraunPatti L Pippenger, female    DOB: 11-17-1971, 44 y.o.   MRN: [id number]  Pt presents to the office today for hypertension. Pt has been taking her BP at home for the last week with averaging 150's/100. Hypertension This is a new problem. The current episode started 1 to 4 weeks ago. The problem has been waxing and waning since onset. The problem is uncontrolled. Associated symptoms include anxiety, headaches and palpitations. Pertinent negatives include no chest pain, malaise/fatigue, peripheral edema or shortness of breath. Risk factors for coronary artery disease include obesity and stress. Past treatments include nothing. The current treatment provides no improvement. There is no history of kidney disease, CAD/MI, CVA, heart failure or a thyroid problem.      Review of Systems  Constitutional: Negative.  Negative for malaise/fatigue.  Eyes: Negative.   Respiratory: Negative.  Negative for shortness of breath.   Cardiovascular: Positive for palpitations. Negative for chest pain.  Gastrointestinal: Negative.   Endocrine: Negative.   Genitourinary: Negative.   Musculoskeletal: Negative.   Neurological: Positive for headaches.  Hematological: Negative.   Psychiatric/Behavioral: Negative.   All other systems reviewed and are negative.      Objective:   Physical Exam  Constitutional: She is oriented to person, place, and time. She appears well-developed and well-nourished. No distress.  HENT:  Head: Normocephalic and atraumatic.  Right Ear: External ear normal.  Left Ear: External ear normal.  Mouth/Throat: Oropharynx is clear and moist.  Nasal passage erythemas with mild swelling    Eyes: Pupils are equal, round, and reactive to light.  Neck: Normal range of motion. Neck supple. No thyromegaly present.  Cardiovascular: Normal rate, regular rhythm, normal heart sounds and intact distal pulses.   No murmur heard. Pulmonary/Chest: Effort normal and  breath sounds normal. No respiratory distress. She has no wheezes.  Abdominal: Soft. Bowel sounds are normal. She exhibits no distension. There is no tenderness.  Musculoskeletal: Normal range of motion. She exhibits no edema or tenderness.  Neurological: She is alert and oriented to person, place, and time. She has normal reflexes. No cranial nerve deficit.  Skin: Skin is warm and dry.  Psychiatric: She has a normal mood and affect. Her behavior is normal. Judgment and thought content normal.  Vitals reviewed.     BP 158/95 mmHg  Pulse 78  Temp(Src) 97.4 F (36.3 C) (Oral)  Ht 5\' 4"  (1.626 m)  Wt 209 lb (94.802 kg)  BMI 35.86 kg/m2     Assessment & Plan:  1. Allergic rhinitis, unspecified allergic rhinitis type Continue to Zyrtec  - mometasone (NASONEX) 50 MCG/ACT nasal spray; Place 2 sprays into the nose daily.  Dispense: 17 g; Refill: 12  2. Essential hypertension Pt started on HCTZ 25 mg today -Daily blood pressure log given with instructions on how to fill out and told to bring to next visit -Dash diet information given -Exercise encouraged - Stress Management  -Continue current meds -RTO in 2 weeks  - hydrochlorothiazide (HYDRODIURIL) 25 MG tablet; Take 1 tablet (25 mg total) by mouth daily.  Dispense: 90 tablet; Refill: 3  Jannifer Rodneyhristy Hawks, FNP

## 2015-11-08 ENCOUNTER — Encounter: Payer: Self-pay | Admitting: Family

## 2015-11-08 ENCOUNTER — Ambulatory Visit (INDEPENDENT_AMBULATORY_CARE_PROVIDER_SITE_OTHER): Payer: BLUE CROSS/BLUE SHIELD | Admitting: Family

## 2015-11-08 VITALS — BP 120/89 | HR 87 | Temp 99.0°F | Ht 64.0 in | Wt 208.4 lb

## 2015-11-08 DIAGNOSIS — K2100 Gastro-esophageal reflux disease with esophagitis, without bleeding: Secondary | ICD-10-CM

## 2015-11-08 DIAGNOSIS — I10 Essential (primary) hypertension: Secondary | ICD-10-CM | POA: Diagnosis not present

## 2015-11-08 DIAGNOSIS — E876 Hypokalemia: Secondary | ICD-10-CM | POA: Diagnosis not present

## 2015-11-08 MED ORDER — OMEPRAZOLE 20 MG PO CPDR: 20.0000 mg | DELAYED_RELEASE_CAPSULE | Freq: Every day | ORAL | Status: DC

## 2015-11-08 NOTE — Patient Instructions (Signed)
DASH Eating Plan °DASH stands for "Dietary Approaches to Stop Hypertension." The DASH eating plan is a healthy eating plan that has been shown to reduce high blood pressure (hypertension). Additional health benefits may include reducing the risk of type 2 diabetes mellitus, heart disease, and stroke. The DASH eating plan may also help with weight loss. °WHAT DO I NEED TO KNOW ABOUT THE DASH EATING PLAN? °For the DASH eating plan, you will follow these general guidelines: °· Choose foods with a percent daily value for sodium of less than 5% (as listed on the food label). °· Use salt-free seasonings or herbs instead of table salt or sea salt. °· Check with your health care provider or pharmacist before using salt substitutes. °· Eat lower-sodium products, often labeled as "lower sodium" or "no salt added." °· Eat fresh foods. °· Eat more vegetables, fruits, and low-fat dairy products. °· Choose whole grains. Look for the word "whole" as the first word in the ingredient list. °· Choose fish and skinless chicken or turkey more often than red meat. Limit fish, poultry, and meat to 6 oz (170 g) each day. °· Limit sweets, desserts, sugars, and sugary drinks. °· Choose heart-healthy fats. °· Limit cheese to 1 oz (28 g) per day. °· Eat more home-cooked food and less restaurant, buffet, and fast food. °· Limit fried foods. °· Cook foods using methods other than frying. °· Limit canned vegetables. If you do use them, rinse them well to decrease the sodium. °· When eating at a restaurant, ask that your food be prepared with less salt, or no salt if possible. °WHAT FOODS CAN I EAT? °Seek help from a dietitian for individual calorie needs. °Grains °Whole grain or whole wheat bread. Brown rice. Whole grain or whole wheat pasta. Quinoa, bulgur, and whole grain cereals. Low-sodium cereals. Corn or whole wheat flour tortillas. Whole grain cornbread. Whole grain crackers. Low-sodium crackers. °Vegetables °Fresh or frozen vegetables  (raw, steamed, roasted, or grilled). Low-sodium or reduced-sodium tomato and vegetable juices. Low-sodium or reduced-sodium tomato sauce and paste. Low-sodium or reduced-sodium canned vegetables.  °Fruits °All fresh, canned (in natural juice), or frozen fruits. °Meat and Other Protein Products °Ground beef (85% or leaner), grass-fed beef, or beef trimmed of fat. Skinless chicken or turkey. Ground chicken or turkey. Pork trimmed of fat. All fish and seafood. Eggs. Dried beans, peas, or lentils. Unsalted nuts and seeds. Unsalted canned beans. °Dairy °Low-fat dairy products, such as skim or 1% milk, 2% or reduced-fat cheeses, low-fat ricotta or cottage cheese, or plain low-fat yogurt. Low-sodium or reduced-sodium cheeses. °Fats and Oils °Tub margarines without trans fats. Light or reduced-fat mayonnaise and salad dressings (reduced sodium). Avocado. Safflower, olive, or canola oils. Natural peanut or almond butter. °Other °Unsalted popcorn and pretzels. °The items listed above may not be a complete list of recommended foods or beverages. Contact your dietitian for more options. °WHAT FOODS ARE NOT RECOMMENDED? °Grains °White bread. White pasta. White rice. Refined cornbread. Bagels and croissants. Crackers that contain trans fat. °Vegetables °Creamed or fried vegetables. Vegetables in a cheese sauce. Regular canned vegetables. Regular canned tomato sauce and paste. Regular tomato and vegetable juices. °Fruits °Dried fruits. Canned fruit in light or heavy syrup. Fruit juice. °Meat and Other Protein Products °Fatty cuts of meat. Ribs, chicken wings, bacon, sausage, bologna, salami, chitterlings, fatback, hot dogs, bratwurst, and packaged luncheon meats. Salted nuts and seeds. Canned beans with salt. °Dairy °Whole or 2% milk, cream, half-and-half, and cream cheese. Whole-fat or sweetened yogurt. Full-fat   cheeses or blue cheese. Nondairy creamers and whipped toppings. Processed cheese, cheese spreads, or cheese  curds. °Condiments °Onion and garlic salt, seasoned salt, table salt, and sea salt. Canned and packaged gravies. Worcestershire sauce. Tartar sauce. Barbecue sauce. Teriyaki sauce. Soy sauce, including reduced sodium. Steak sauce. Fish sauce. Oyster sauce. Cocktail sauce. Horseradish. Ketchup and mustard. Meat flavorings and tenderizers. Bouillon cubes. Hot sauce. Tabasco sauce. Marinades. Taco seasonings. Relishes. °Fats and Oils °Butter, stick margarine, lard, shortening, ghee, and bacon fat. Coconut, palm kernel, or palm oils. Regular salad dressings. °Other °Pickles and olives. Salted popcorn and pretzels. °The items listed above may not be a complete list of foods and beverages to avoid. Contact your dietitian for more information. °WHERE CAN I FIND MORE INFORMATION? °[employer], [employer]: www.nhlbi.nih.gov/health/health-topics/topics/dash/ °  °This information is not intended to replace advice given to you by your health care provider. Make sure you discuss any questions you have with your health care provider. °  °Document Released: 10/30/2011 Document Revised: 12/01/2014 Document Reviewed: 09/14/2013 °Elsevier Interactive Patient Education ©2016 [employer] Inc[employer] ° °Hypertension °Hypertension, commonly called high blood pressure, is when the force of blood pumping through your arteries is too strong. Your arteries are the blood vessels that carry blood from your heart throughout your body. A blood pressure reading consists of a higher number over a lower number, such as 110/72. The higher number (systolic) is the pressure inside your arteries when your heart pumps. The lower number (diastolic) is the pressure inside your arteries when your heart relaxes. Ideally you want your blood pressure below 120/80. °Hypertension forces your heart to work harder to pump blood. Your arteries may become narrow or stiff. Having untreated or uncontrolled hypertension can cause heart attack, stroke, kidney  disease, and other problems. °RISK FACTORS °Some risk factors for high blood pressure are controllable. Others are not.  °Risk factors you cannot control include:  °· Race. You may be at higher risk if you are African American. °· Age. Risk increases with age. °· Gender. Men are at higher risk than women before age 45 years. After age 65, women are at higher risk than men. °Risk factors you can control include: °· Not getting enough exercise or physical activity. °· Being overweight. °· Getting too much fat, sugar, calories, or salt in your diet. °· Drinking too much alcohol. °SIGNS AND SYMPTOMS °Hypertension does not usually cause signs or symptoms. Extremely high blood pressure (hypertensive crisis) may cause headache, anxiety, shortness of breath, and nosebleed. °DIAGNOSIS °To check if you have hypertension, your health care provider will measure your blood pressure while you are seated, with your arm held at the level of your heart. It should be measured at least twice using the same arm. Certain conditions can cause a difference in blood pressure between your right and left arms. A blood pressure reading that is higher than normal on one occasion does not mean that you need treatment. If it is not clear whether you have high blood pressure, you may be asked to return on a different day to have your blood pressure checked again. Or, you may be asked to monitor your blood pressure at home for 1 or more weeks. °TREATMENT °Treating high blood pressure includes making lifestyle changes and possibly taking medicine. Living a healthy lifestyle can help lower high blood pressure. You may need to change some of your habits. °Lifestyle changes may include: °· Following the DASH diet. This diet is high in fruits, vegetables, and whole   grains. It is low in salt, red meat, and added sugars. °· Keep your sodium intake below 2,300 mg per day. °· Getting at least 30-45 minutes of aerobic exercise at least 4 times per  week. °· Losing weight if necessary. °· Not smoking. °· Limiting alcoholic beverages. °· Learning ways to reduce stress. °Your health care provider may prescribe medicine if lifestyle changes are not enough to get your blood pressure under control, and if one of the following is true: °· You are 18-59 years of age and your systolic blood pressure is above 140. °· You are 60 years of age or older, and your systolic blood pressure is above 150. °· Your diastolic blood pressure is above 90. °· You have diabetes, and your systolic blood pressure is over 140 or your diastolic blood pressure is over 90. °· You have kidney disease and your blood pressure is above 140/90. °· You have heart disease and your blood pressure is above 140/90. °Your personal target blood pressure may vary depending on your medical conditions, your age, and other factors. °HOME CARE INSTRUCTIONS °· Have your blood pressure rechecked as directed by your health care provider.   °· Take medicines only as directed by your health care provider. Follow the directions carefully. Blood pressure medicines must be taken as prescribed. The medicine does not work as well when you skip doses. Skipping doses also puts you at risk for problems. °· Do not smoke.   °· Monitor your blood pressure at home as directed by your health care provider.  °SEEK MEDICAL CARE IF:  °· You think you are having a reaction to medicines taken. °· You have recurrent headaches or feel dizzy. °· You have swelling in your ankles. °· You have trouble with your vision. °SEEK IMMEDIATE MEDICAL CARE IF: °· You develop a severe headache or confusion. °· You have unusual weakness, numbness, or feel faint. °· You have severe chest or abdominal pain. °· You vomit repeatedly. °· You have trouble breathing. °MAKE SURE YOU:  °· Understand these instructions. °· Will watch your condition. °· Will get help right away if you are not doing well or get worse. °  °This information is not intended to  replace advice given to you by your health care provider. Make sure you discuss any questions you have with your health care provider. °  °Document Released: 11/10/2005 Document Revised: 03/27/2015 Document Reviewed: 09/02/2013 °Elsevier Interactive Patient Education ©2016 Elsevier Inc. ° °

## 2015-11-08 NOTE — Addendum Note (Signed)
Addended by: Jannifer RodneyHAWKS, Jabaree Mercado A on: 11/08/2015 04:52 PM   Modules accepted: Orders

## 2015-11-08 NOTE — Progress Notes (Signed)
   Subjective:    Patient ID: Kathryn Mitchell, female    DOB: [birth year], 44 y.o.   MRN: [id number]  Hypertension This is a chronic problem. The current episode started 1 to 4 weeks ago. The problem has been resolved since onset. The problem is controlled. Pertinent negatives include no anxiety, headaches, malaise/fatigue, palpitations, peripheral edema or shortness of breath. Risk factors for coronary artery disease include sedentary lifestyle. Past treatments include diuretics. The current treatment provides significant improvement. There is no history of kidney disease, CAD/MI, CVA, heart failure or a thyroid problem. There is no history of sleep apnea.      Review of Systems  Constitutional: Negative.  Negative for malaise/fatigue.  HENT: Negative.   Eyes: Negative.   Respiratory: Negative.  Negative for shortness of breath.   Cardiovascular: Negative.  Negative for palpitations.  Gastrointestinal: Negative.   Endocrine: Negative.   Genitourinary: Negative.   Musculoskeletal: Negative.   Neurological: Negative.  Negative for headaches.  Hematological: Negative.   Psychiatric/Behavioral: Negative.   All other systems reviewed and are negative.      Objective:   Physical Exam  Constitutional: She is oriented to person, place, and time. She appears well-developed and well-nourished. No distress.  HENT:  Head: Normocephalic and atraumatic.  Right Ear: External ear normal.  Left Ear: External ear normal.  Nose: Nose normal.  Mouth/Throat: Oropharynx is clear and moist.  Eyes: Pupils are equal, round, and reactive to light.  Neck: Normal range of motion. Neck supple. No thyromegaly present.  Cardiovascular: Normal rate, regular rhythm, normal heart sounds and intact distal pulses.   No murmur heard. Pulmonary/Chest: Effort normal and breath sounds normal. No respiratory distress. She has no wheezes.  Abdominal: Soft. Bowel sounds are normal. She exhibits no distension. There is  no tenderness.  Musculoskeletal: Normal range of motion. She exhibits no edema or tenderness.  Neurological: She is alert and oriented to person, place, and time. She has normal reflexes. No cranial nerve deficit.  Skin: Skin is warm and dry.  Psychiatric: She has a normal mood and affect. Her behavior is normal. Judgment and thought content normal.  Vitals reviewed.    BP 120/89 mmHg  Pulse 87  Temp(Src) 99 F (37.2 C) (Oral)  Ht '5\' 4"'$  (1.626 m)  Wt 208 lb 6.4 oz (94.53 kg)  BMI 35.75 kg/m2       Assessment & Plan:  1. Essential hypertension, benign --Daily blood pressure log given with instructions on how to fill out and told to bring to next visit -Dash diet information given -Exercise encouraged - Stress Management  -Continue current meds -RTO in 6 months  - BMP8+EGFR  Evelina Dun, FNP

## 2015-11-09 ENCOUNTER — Other Ambulatory Visit: Payer: Self-pay | Admitting: Family

## 2015-11-09 LAB — BMP8+EGFR
BUN/Creatinine Ratio: 17 (ref 9–23)
BUN: 11 mg/dL (ref 6–24)
CALCIUM: 9.6 mg/dL (ref 8.7–10.2)
CO2: 27 mmol/L (ref 18–29)
Chloride: 95 mmol/L — ABNORMAL LOW (ref 96–106)
Creatinine, Ser: 0.65 mg/dL (ref 0.57–1.00)
GFR calc Af Amer: 124 mL/min/{1.73_m2} (ref >59)
GFR calc non Af Amer: 108 mL/min/{1.73_m2} (ref >59)
GLUCOSE: 108 mg/dL — AB (ref 65–99)
POTASSIUM: 3.3 mmol/L — AB (ref 3.5–5.2)
Sodium: 139 mmol/L (ref 134–144)

## 2015-11-09 MED ORDER — POTASSIUM CHLORIDE ER 10 MEQ PO TBCR: 10.0000 meq | EXTENDED_RELEASE_TABLET | Freq: Every day | ORAL | Status: DC

## 2015-11-09 NOTE — Addendum Note (Signed)
Addended by: Tamera PuntWRAY, Hughes Wyndham S on: 11/09/2015 10:21 AM   Modules accepted: Orders

## 2015-12-18 ENCOUNTER — Other Ambulatory Visit: Payer: Self-pay

## 2015-12-18 DIAGNOSIS — I10 Essential (primary) hypertension: Secondary | ICD-10-CM

## 2015-12-18 MED ORDER — HYDROCHLOROTHIAZIDE 25 MG PO TABS
25.0000 mg | ORAL_TABLET | Freq: Every day | ORAL | Status: DC
Start: 2015-12-18 — End: 2016-02-10

## 2016-01-18 ENCOUNTER — Telehealth: Payer: Self-pay | Admitting: Family

## 2016-01-18 MED ORDER — AMOXICILLIN 500 MG PO TABS: 500.0000 mg | ORAL_TABLET | Freq: Three times a day (TID) | ORAL | Status: DC

## 2016-01-18 NOTE — Telephone Encounter (Signed)
I sent in a rx of amoxicillin. PT needs to follow up with her dentist.

## 2016-02-05 ENCOUNTER — Encounter: Payer: Self-pay | Admitting: Family

## 2016-02-10 ENCOUNTER — Other Ambulatory Visit: Payer: Self-pay | Admitting: Family

## 2016-03-31 ENCOUNTER — Other Ambulatory Visit: Payer: Self-pay | Admitting: Family

## 2016-07-04 ENCOUNTER — Encounter: Payer: Self-pay | Admitting: Family

## 2016-07-04 ENCOUNTER — Ambulatory Visit (INDEPENDENT_AMBULATORY_CARE_PROVIDER_SITE_OTHER): Payer: BLUE CROSS/BLUE SHIELD | Admitting: Family

## 2016-07-04 VITALS — BP 127/80 | HR 77 | Temp 97.4°F | Ht 64.0 in | Wt 200.0 lb

## 2016-07-04 DIAGNOSIS — R198 Other specified symptoms and signs involving the digestive system and abdomen: Secondary | ICD-10-CM | POA: Diagnosis not present

## 2016-07-04 DIAGNOSIS — Z87442 Personal history of urinary calculi: Secondary | ICD-10-CM

## 2016-07-04 DIAGNOSIS — R109 Unspecified abdominal pain: Secondary | ICD-10-CM

## 2016-07-04 DIAGNOSIS — R300 Dysuria: Secondary | ICD-10-CM

## 2016-07-04 LAB — URINALYSIS
Bilirubin, UA: NEGATIVE
Glucose, UA: NEGATIVE
Leukocytes, UA: NEGATIVE
Nitrite, UA: NEGATIVE
Protein, UA: NEGATIVE
RBC, UA: NEGATIVE
Specific Gravity, UA: 1.02 (ref 1.005–1.030)
Urobilinogen, Ur: 1 mg/dL (ref 0.2–1.0)
pH, UA: 7 (ref 5.0–7.5)

## 2016-07-04 MED ORDER — HYDROCHLOROTHIAZIDE 25 MG PO TABS: 25.0000 mg | ORAL_TABLET | Freq: Every day | ORAL | 0 refills | Status: DC

## 2016-07-04 NOTE — Patient Instructions (Signed)
Cholecystitis Cholecystitis is inflammation of the gallbladder. It is often called a gallbladder attack. The gallbladder is a pear-shaped organ that lies beneath the liver on the right side of the body. The gallbladder stores bile, which is a fluid that helps the body to digest fats. If bile builds up in your gallbladder, your gallbladder becomes inflamed. This condition may occur suddenly (be acute). Repeat episodes of acute cholecystitis or prolonged episodes may lead to a long-term (chronic) condition. Cholecystitis is serious and it requires treatment.  CAUSES The most common cause of this condition is gallstones. Gallstones can block the tube (duct) that carries bile out of your gallbladder. This causes bile to build up. Other causes of this condition include:  Damage to the gallbladder due to a decrease in blood flow.  Infections in the bile ducts.  Scars or kinks in the bile ducts.  Tumors in the liver, pancreas, or gallbladder. RISK FACTORS This condition is more likely to develop in:  People who have sickle cell disease.  People who take birth control pills or use estrogen.  People who have alcoholic liver disease.  People who have liver cirrhosis.  People who have their nutrition delivered through a vein (parenteral nutrition).  People who do not eat or drink (do fasting) for a long period of time.  People who are obese.  People who have rapid weight loss.  People who are pregnant.  People who have increased triglyceride levels.  People who have pancreatitis. SYMPTOMS Symptoms of this condition include:  Abdominal pain, especially in the upper right area of the abdomen.  Abdominal tenderness or bloating.  Nausea.  Vomiting.  Fever.  Chills.  Yellowing of the skin and the whites of the eyes (jaundice). DIAGNOSIS This condition is diagnosed with a medical history and physical exam. You may also have other tests, including:  Imaging tests, such as:  An  ultrasound of the gallbladder.  A CT scan of the abdomen.  A gallbladder nuclear scan (HIDA scan). This scan allows your health care provider to see the bile moving from your liver to your gallbladder and to your small intestine.  MRI.  Blood tests, such as:  A complete blood count, because the white blood cell count may be higher than normal.  Liver function tests, because some levels may be higher than normal with certain types of gallstones. TREATMENT Treatment may include:  Fasting for a certain amount of time.  IV fluids.  Medicine to treat pain or vomiting.  Antibiotic medicine.  Surgery to remove your gallbladder (cholecystectomy). This may happen immediately or at a later time. HOME CARE INSTRUCTIONS Home care will depend on your treatment. In general:  Take over-the-counter and prescription medicines only as told by your health care provider.  If you were prescribed an antibiotic medicine, take it as told by your health care provider. Do not stop taking the antibiotic even if you start to feel better.  Follow instructions from your health care provider about what to eat or drink. When you are allowed to eat, avoid eating or drinking anything that triggers your symptoms.  Keep all follow-up visits as told by your health care provider. This is important. SEEK MEDICAL CARE IF:  Your pain is not controlled with medicine.  You have a fever. SEEK IMMEDIATE MEDICAL CARE IF:  Your pain moves to another part of your abdomen or to your back.  You continue to have symptoms or you develop new symptoms even with treatment.   This information   is not intended to replace advice given to you by your health care provider. Make sure you discuss any questions you have with your health care provider.   Document Released: 11/10/2005 Document Revised: 08/01/2015 Document Reviewed: 02/21/2015 Elsevier Interactive Patient Education 2016 Elsevier Inc.  

## 2016-07-04 NOTE — Progress Notes (Signed)
Subjective:    Patient ID: Kathryn Mitchell, female    DOB: 09-05-1971, 45 y.o.   MRN: [id number]  Pt presents to the office with complaint of left flank last week of 10 out 10. Pt states she was woke up with the pain and has a history of kidney stones, but they were in right kidney. PT states she had trouble urinating for a day or two, but not denies any flank pain or urinary symptoms. PT is also complaining of intermittent RUQ fullness that started about a year ago and is unchanged. PT states the fullness is worse when she eats fried foods.  Back Pain  This is a new problem. The current episode started in the past 7 days. The problem occurs intermittently. The problem has been gradually improving since onset. Pain location: left flank. The pain does not radiate. The pain is at a severity of 2/10 (now, pt did have 10 out 10 last week). The pain is moderate. Associated symptoms include abdominal pain. Pertinent negatives include no bladder incontinence or bowel incontinence. Risk factors: History of kidney stones. She has tried bed rest and NSAIDs for the symptoms. The treatment provided mild relief.  Abdominal Pain  This is a chronic problem. The current episode started more than 1 year ago. The onset quality is gradual. The problem occurs intermittently. The problem has been waxing and waning. The pain is located in the RUQ. The pain is mild. The quality of the pain is dull. Associated symptoms include constipation. Pertinent negatives include no belching, diarrhea, nausea or vomiting. The pain is aggravated by eating (fried foods). The pain is relieved by nothing. The treatment provided no relief.      Review of Systems  Gastrointestinal: Positive for abdominal pain and constipation. Negative for bowel incontinence, diarrhea, nausea and vomiting.  Genitourinary: Negative for bladder incontinence.  Musculoskeletal: Positive for back pain.  All other systems reviewed and are negative.        Objective:   Physical Exam  Constitutional: She is oriented to person, place, and time. She appears well-developed and well-nourished. No distress.  HENT:  Head: Normocephalic.  Eyes: Pupils are equal, round, and reactive to light.  Neck: Normal range of motion. Neck supple. No thyromegaly present.  Cardiovascular: Normal rate, regular rhythm, normal heart sounds and intact distal pulses.   No murmur heard. Pulmonary/Chest: Effort normal and breath sounds normal. No respiratory distress. She has no wheezes.  Abdominal: Soft. Bowel sounds are normal. She exhibits no distension. There is no tenderness.  Musculoskeletal: Normal range of motion. She exhibits no edema or tenderness.  Neurological: She is alert and oriented to person, place, and time.  Skin: Skin is warm and dry.  Psychiatric: She has a normal mood and affect. Her behavior is normal. Judgment and thought content normal.  Vitals reviewed.    BP 127/80 (BP Location: Right Arm)   Pulse 77   Temp 97.4 F (36.3 C) (Oral)   Ht  (1.626 m)   Wt 200 lb (90.7 kg)   LMP 06/13/2016   BMI 34.33 kg/m       Assessment & Plan:  1. Difficult or painful urination - Urinalysis - Urine culture - US Abdomen Complete; Future  2. Left flank pain - US Abdomen Complete; Future  3. RUQ fullness - US Abdomen Complete; Future  4. History of nephrolithiasis  I believe pt had kidney stone and passed it last week. Pt states she is no longer having flank pain  or having any hematuria. We will do ultrasound since pt continues to have RUQ fullness and pain. Pt does have several risk factors for gallbladder disease- obesity, forties, female, and fertile RTO prn   Jannifer Rodneyhristy Jossilyn Benda, [place]NP

## 2016-07-07 LAB — URINE CULTURE

## 2016-07-15 ENCOUNTER — Ambulatory Visit (HOSPITAL_COMMUNITY): Admission: RE | Admit: 2016-07-15 | Payer: BLUE CROSS/BLUE SHIELD | Source: Ambulatory Visit

## 2016-07-21 ENCOUNTER — Other Ambulatory Visit: Payer: Self-pay | Admitting: Family

## 2016-07-22 ENCOUNTER — Ambulatory Visit (HOSPITAL_COMMUNITY): Payer: BLUE CROSS/BLUE SHIELD

## 2016-09-27 ENCOUNTER — Other Ambulatory Visit: Payer: Self-pay | Admitting: Family

## 2016-09-27 DIAGNOSIS — K2100 Gastro-esophageal reflux disease with esophagitis, without bleeding: Secondary | ICD-10-CM

## 2016-09-29 ENCOUNTER — Telehealth: Payer: Self-pay | Admitting: *Deleted

## 2016-09-29 MED ORDER — HYDROCHLOROTHIAZIDE 25 MG PO TABS: 25.0000 mg | ORAL_TABLET | Freq: Every day | ORAL | 0 refills | Status: DC

## 2016-09-29 NOTE — Telephone Encounter (Signed)
rx sent

## 2017-01-01 ENCOUNTER — Ambulatory Visit (INDEPENDENT_AMBULATORY_CARE_PROVIDER_SITE_OTHER): Payer: BLUE CROSS/BLUE SHIELD | Admitting: Family

## 2017-01-01 ENCOUNTER — Encounter: Payer: Self-pay | Admitting: Family

## 2017-01-01 VITALS — BP 113/78 | HR 74 | Temp 97.3°F | Ht 64.0 in | Wt 180.0 lb

## 2017-01-01 DIAGNOSIS — K2100 Gastro-esophageal reflux disease with esophagitis, without bleeding: Secondary | ICD-10-CM

## 2017-01-01 DIAGNOSIS — N915 Oligomenorrhea, unspecified: Secondary | ICD-10-CM

## 2017-01-01 DIAGNOSIS — N951 Menopausal and female climacteric states: Secondary | ICD-10-CM | POA: Diagnosis not present

## 2017-01-01 MED ORDER — OMEPRAZOLE 20 MG PO CPDR: 20.0000 mg | DELAYED_RELEASE_CAPSULE | Freq: Every day | ORAL | 0 refills | Status: DC

## 2017-01-01 MED ORDER — HYDROCHLOROTHIAZIDE 25 MG PO TABS
25.0000 mg | ORAL_TABLET | Freq: Every day | ORAL | 0 refills | Status: DC
Start: 2017-01-01 — End: 2017-02-24

## 2017-01-01 MED ORDER — PAROXETINE HCL 20 MG PO TABS: 20.0000 mg | ORAL_TABLET | Freq: Every day | ORAL | 1 refills | Status: DC

## 2017-01-01 NOTE — Progress Notes (Signed)
   Subjective:    Patient ID: Kathryn Mitchell, female    DOB: Sep 15, 1971, 46 y.o.   MRN: [id number]  HPI Pt presents to the office today with irregualar menses. Pt states states she had a tubal ligation about 4 years ago. At that time she went off her OC. PT states her periods at that time would be every 28 days and last 3-4 days. However, over the last year her periods have become more irregular. States last summer she went 8 weeks with out a period. Then around November she has started having heavy bleeding that would last 10-14 days and will come every 2-4 weeks. PT states her mother died of cervical cancer and worries. She has a pap scheduled on March 8.   PT reports a few hot flashes and mood swings. Feels more anxious and "less patience" with people that has become worse over the last several months.    Review of Systems  All other systems reviewed and are negative.      Objective:   Physical Exam  Constitutional: She is oriented to person, place, and time. She appears well-developed and well-nourished. No distress.  HENT:  Head: Normocephalic and atraumatic.  Eyes: Pupils are equal, round, and reactive to light.  Neck: Normal range of motion. Neck supple. No thyromegaly present.  Cardiovascular: Normal rate, regular rhythm, normal heart sounds and intact distal pulses.   No murmur heard. Pulmonary/Chest: Effort normal and breath sounds normal. No respiratory distress. She has no wheezes.  Abdominal: Soft. Bowel sounds are normal. She exhibits no distension. There is no tenderness.  Musculoskeletal: Normal range of motion. She exhibits no edema or tenderness.  Neurological: She is alert and oriented to person, place, and time.  Skin: Skin is warm and dry.  Psychiatric: She has a normal mood and affect. Her behavior is normal. Judgment and thought content normal.  Vitals reviewed.     BP 113/78   Pulse 74   Temp 97.3 F (36.3 C) (Oral)   Ht '5\' 4"'$  (1.626 m)   Wt 180 lb  (81.6 kg)   LMP 01/01/2017   BMI 30.90 kg/m      Assessment & Plan:  1. Oligomenorrhea, unspecified type -Labs pending Keep appt with GYN - CBC with Differential/Platelet - CMP14+EGFR  2. Vasomotor symptoms due to menopause -Started pt on Paxil 20 mg today -Discussed stress management  RTO in 6 weeks - CBC with Differential/Platelet - CMP14+EGFR - FSH/LH - PARoxetine (PAXIL) 20 MG tablet; Take 1 tablet (20 mg total) by mouth daily.  Dispense: 90 tablet; Refill: [place], FNP

## 2017-01-01 NOTE — Patient Instructions (Signed)
Menopause Menopause is the normal time of life when menstrual periods stop completely. Menopause is complete when you have missed 12 consecutive menstrual periods. It usually occurs between the ages of 48 years and 55 years. Very rarely does a woman develop menopause before the age of 40 years. At menopause, your ovaries stop producing the female hormones estrogen and progesterone. This can cause undesirable symptoms and also affect your health. Sometimes the symptoms may occur 4-5 years before the menopause begins. There is no relationship between menopause and:  Oral contraceptives.  Number of children you had.  Race.  The age your menstrual periods started (menarche).  Heavy smokers and very thin women may develop menopause earlier in life. What are the causes?  The ovaries stop producing the female hormones estrogen and progesterone. Other causes include:  Surgery to remove both ovaries.  The ovaries stop functioning for no known reason.  Tumors of the pituitary gland in the brain.  Medical disease that affects the ovaries and hormone production.  Radiation treatment to the abdomen or pelvis.  Chemotherapy that affects the ovaries.  What are the signs or symptoms?  Hot flashes.  Night sweats.  Decrease in sex drive.  Vaginal dryness and thinning of the vagina causing painful intercourse.  Dryness of the skin and developing wrinkles.  Headaches.  Tiredness.  Irritability.  Memory problems.  Weight gain.  Bladder infections.  Hair growth of the face and chest.  Infertility. More serious symptoms include:  Loss of bone (osteoporosis) causing breaks (fractures).  Depression.  Hardening and narrowing of the arteries (atherosclerosis) causing heart attacks and strokes.  How is this diagnosed?  When the menstrual periods have stopped for 12 straight months.  Physical exam.  Hormone studies of the blood. How is this treated? There are many treatment  choices and nearly as many questions about them. The decisions to treat or not to treat menopausal changes is an individual choice made with your health care provider. Your health care provider can discuss the treatments with you. Together, you can decide which treatment will work best for you. Your treatment choices may include:  Hormone therapy (estrogen and progesterone).  Non-hormonal medicines.  Treating the individual symptoms with medicine (for example antidepressants for depression).  Herbal medicines that may help specific symptoms.  Counseling by a psychiatrist or psychologist.  Group therapy.  Lifestyle changes including: ? Eating healthy. ? Regular exercise. ? Limiting caffeine and alcohol. ? Stress management and meditation.  No treatment.  Follow these instructions at home:  Take the medicine your health care provider gives you as directed.  Get plenty of sleep and rest.  Exercise regularly.  Eat a diet that contains calcium (good for the bones) and soy products (acts like estrogen hormone).  Avoid alcoholic beverages.  Do not smoke.  If you have hot flashes, dress in layers.  Take supplements, calcium, and vitamin D to strengthen bones.  You can use over-the-counter lubricants or moisturizers for vaginal dryness.  Group therapy is sometimes very helpful.  Acupuncture may be helpful in some cases. Contact a health care provider if:  You are not sure you are in menopause.  You are having menopausal symptoms and need advice and treatment.  You are still having menstrual periods after age 55 years.  You have pain with intercourse.  Menopause is complete (no menstrual period for 12 months) and you develop vaginal bleeding.  You need a referral to a specialist (gynecologist, psychiatrist, or psychologist) for treatment. Get help right   away if:  You have severe depression.  You have excessive vaginal bleeding.  You fell and think you have a  broken bone.  You have pain when you urinate.  You develop leg or chest pain.  You have a fast pounding heart beat (palpitations).  You have severe headaches.  You develop vision problems.  You feel a lump in your breast.  You have abdominal pain or severe indigestion. This information is not intended to replace advice given to you by your health care provider. Make sure you discuss any questions you have with your health care provider. Document Released: 01/31/2004 Document Revised: 04/17/2016 Document Reviewed: 06/09/2013 Elsevier Interactive Patient Education  2017 Elsevier Inc.  

## 2017-01-01 NOTE — Addendum Note (Signed)
Addended by: Jannifer RodneyHAWKS, Viera Okonski A on: 01/01/2017 12:39 PM   Modules accepted: Orders

## 2017-01-02 LAB — CMP14+EGFR
A/G RATIO: 1.2 (ref 1.2–2.2)
ALBUMIN: 3.6 g/dL (ref 3.5–5.5)
ALT: 11 IU/L (ref 0–32)
AST: 14 IU/L (ref 0–40)
Alkaline Phosphatase: 88 IU/L (ref 39–117)
BUN/Creatinine Ratio: 13 (ref 9–23)
BUN: 8 mg/dL (ref 6–24)
Bilirubin Total: 0.2 mg/dL (ref 0.0–1.2)
CALCIUM: 9 mg/dL (ref 8.7–10.2)
CO2: 26 mmol/L (ref 18–29)
CREATININE: 0.6 mg/dL (ref 0.57–1.00)
Chloride: 96 mmol/L (ref 96–106)
GFR, EST AFRICAN AMERICAN: 126 mL/min/{1.73_m2} (ref >59)
GFR, EST NON AFRICAN AMERICAN: 110 mL/min/{1.73_m2} (ref >59)
GLOBULIN, TOTAL: 3 g/dL (ref 1.5–4.5)
Glucose: 85 mg/dL (ref 65–99)
Potassium: 3.5 mmol/L (ref 3.5–5.2)
SODIUM: 140 mmol/L (ref 134–144)
TOTAL PROTEIN: 6.6 g/dL (ref 6.0–8.5)

## 2017-01-02 LAB — CBC WITH DIFFERENTIAL/PLATELET
Basophils Absolute: 0 10*3/uL (ref 0.0–0.2)
Basos: 0 %
EOS (ABSOLUTE): 0.1 10*3/uL (ref 0.0–0.4)
EOS: 1 %
HEMATOCRIT: 37.4 % (ref 34.0–46.6)
HEMOGLOBIN: 12.4 g/dL (ref 11.1–15.9)
IMMATURE GRANS (ABS): 0 10*3/uL (ref 0.0–0.1)
IMMATURE GRANULOCYTES: 0 %
LYMPHS: 32 %
Lymphocytes Absolute: 2.8 10*3/uL (ref 0.7–3.1)
MCH: 27.4 pg (ref 26.6–33.0)
MCHC: 33.2 g/dL (ref 31.5–35.7)
MCV: 83 fL (ref 79–97)
MONOCYTES: 5 %
Monocytes Absolute: 0.4 10*3/uL (ref 0.1–0.9)
NEUTROS PCT: 62 %
Neutrophils Absolute: 5.5 10*3/uL (ref 1.4–7.0)
Platelets: 308 10*3/uL (ref 150–379)
RBC: 4.52 x10E6/uL (ref 3.77–5.28)
RDW: 15.8 % — AB (ref 12.3–15.4)
WBC: 8.9 10*3/uL (ref 3.4–10.8)

## 2017-01-02 LAB — FSH/LH
FSH: 4.5 m[IU]/mL
LH: 3.6 m[IU]/mL

## 2017-01-07 ENCOUNTER — Telehealth: Payer: Self-pay | Admitting: Family

## 2017-01-07 NOTE — Telephone Encounter (Signed)
Noted  

## 2017-01-10 ENCOUNTER — Ambulatory Visit (INDEPENDENT_AMBULATORY_CARE_PROVIDER_SITE_OTHER): Payer: BLUE CROSS/BLUE SHIELD | Admitting: Family Medicine

## 2017-01-10 VITALS — BP 103/67 | HR 69 | Temp 97.8°F | Ht 64.0 in | Wt 181.0 lb

## 2017-01-10 DIAGNOSIS — B349 Viral infection, unspecified: Secondary | ICD-10-CM

## 2017-01-10 DIAGNOSIS — R6883 Chills (without fever): Secondary | ICD-10-CM | POA: Diagnosis not present

## 2017-01-10 MED ORDER — AZITHROMYCIN 250 MG PO TABS: ORAL_TABLET | ORAL | 0 refills | Status: DC

## 2017-01-10 MED ORDER — FLUTICASONE PROPIONATE 50 MCG/ACT NA SUSP: 2.0000 | Freq: Every day | NASAL | 11 refills | Status: DC

## 2017-01-10 NOTE — Patient Instructions (Signed)
Great to meet you!  Try mucinex Dm 12 hour if needed for cough, tylenol as needed for chills, and continue the flonase.  Zyrtec is also a good idea  Come back or call with any concerns.    Viral Illness, Adult Viruses are tiny germs that can get into a person's body and cause illness. There are many different types of viruses, and they cause many types of illness. Viral illnesses can range from mild to severe. They can affect various parts of the body. Common illnesses that are caused by a virus include colds and the flu. Viral illnesses also include serious conditions such as HIV/AIDS (human immunodeficiency virus/acquired immunodeficiency syndrome). A few viruses have been linked to certain cancers. What are the causes? Many types of viruses can cause illness. Viruses invade cells in your body, multiply, and cause the infected cells to malfunction or die. When the cell dies, it releases more of the virus. When this happens, you develop symptoms of the illness, and the virus continues to spread to other cells. If the virus takes over the function of the cell, it can cause the cell to divide and grow out of control, as is the case when a virus causes cancer. Different viruses get into the body in different ways. You can get a virus by:  Swallowing food or water that is contaminated with the virus.  Breathing in droplets that have been coughed or sneezed into the air by an infected person.  Touching a surface that has been contaminated with the virus and then touching your eyes, nose, or mouth.  Being bitten by an insect or animal that carries the virus.  Having sexual contact with a person who is infected with the virus.  Being exposed to blood or fluids that contain the virus, either through an open cut or during a transfusion. If a virus enters your body, your body's defense system (immune system) will try to fight the virus. You may be at higher risk for a viral illness if your immune  system is weak. What are the signs or symptoms? Symptoms vary depending on the type of virus and the location of the cells that it invades. Common symptoms of the main types of viral illnesses include: Cold and flu viruses  Fever.  Headache.  Sore throat.  Muscle aches.  Nasal congestion.  Cough. Digestive system (gastrointestinal) viruses  Fever.  Abdominal pain.  Nausea.  Diarrhea. Liver viruses (hepatitis)  Loss of appetite.  Tiredness.  Yellowing of the skin (jaundice). Brain and spinal cord viruses  Fever.  Headache.  Stiff neck.  Nausea and vomiting.  Confusion or sleepiness. Skin viruses  Warts.  Itching.  Rash. Sexually transmitted viruses  Discharge.  Swelling.  Redness.  Rash. How is this treated? Viruses can be difficult to treat because they live within cells. Antibiotic medicines do not treat viruses because these drugs do not get inside cells. Treatment for a viral illness may include:  Resting and drinking plenty of fluids.  Medicines to relieve symptoms. These can include over-the-counter medicine for pain and fever, medicines for cough or congestion, and medicines to relieve diarrhea.  Antiviral medicines. These drugs are available only for certain types of viruses. They may help reduce flu symptoms if taken early. There are also many antiviral medicines for hepatitis and HIV/AIDS. Some viral illnesses can be prevented with vaccinations. A common example is the flu shot. Follow these instructions at home: Medicines  Take over-the-counter and prescription medicines only as told  by your health care provider.  If you were prescribed an antiviral medicine, take it as told by your health care provider. Do not stop taking the medicine even if you start to feel better.  Be aware of when antibiotics are needed and when they are not needed. Antibiotics do not treat viruses. If your health care provider thinks that you may have a  bacterial infection as well as a viral infection, you may get an antibiotic.  Do not ask for an antibiotic prescription if you have been diagnosed with a viral illness. That will not make your illness go away faster.  Frequently taking antibiotics when they are not needed can lead to antibiotic resistance. When this develops, the medicine no longer works against the bacteria that it normally fights. General instructions  Drink enough fluids to keep your urine clear or pale yellow.  Rest as much as possible.  Return to your normal activities as told by your health care provider. Ask your health care provider what activities are safe for you.  Keep all follow-up visits as told by your health care provider. This is important. How is this prevented? Take these actions to reduce your risk of viral infection:  Eat a healthy diet and get enough rest.  Wash your hands often with soap and water. This is especially important when you are in public places. If soap and water are not available, use hand sanitizer.  Avoid close contact with friends and family who have a viral illness.  If you travel to areas where viral gastrointestinal infection is common, avoid drinking water or eating raw food.  Keep your immunizations up to date. Get a flu shot every year as told by your health care provider.  Do not share toothbrushes, nail clippers, razors, or needles with other people.  Always practice safe sex. Contact a health care provider if:  You have symptoms of a viral illness that do not go away.  Your symptoms come back after going away.  Your symptoms get worse. Get help right away if:  You have trouble breathing.  You have a severe headache or a stiff neck.  You have severe vomiting or abdominal pain. This information is not intended to replace advice given to you by your health care provider. Make sure you discuss any questions you have with your health care provider. Document  Released: 03/21/2016 Document Revised: 04/23/2016 Document Reviewed: 03/21/2016 Elsevier Interactive Patient Education  2017 [employer]

## 2017-01-10 NOTE — Progress Notes (Signed)
   HPI  Patient presents today here with chills.  Patient explains that for about one week she had an episode of dry cough with swollen lymph nodes in the bilateral neck.  She was better for a day or two and then had resumption of symptoms with tender lymph nodes, nasal drainage, and chills. The chills really started this morning.  She denies any fevers. She has mild body aches.  He is tolerating food and fluids normally. She does not have any shortness of breath.    PMH: Smoking status noted ROS: Per HPI  Objective: BP 103/67   Pulse 69   Temp 97.8 F (36.6 C) (Oral)   Ht 5\' 4"  (1.626 m)   Wt 181 lb (82.1 kg)   LMP 01/01/2017   BMI 31.07 kg/m  Gen: NAD, alert, cooperative with exam Neck:  Multiple tender LNs, Full ROM without stiffness HEENT: NCAT, oropharynx clear and moist, nares with swollen turbinate on the right, left is clear, TMs normal bilaterally, no tenderness to palpation of the frontal or maxillary sinuses CV: RRR, good S1/S2, no murmur Resp: CTABL, no wheezes, non-labored Ext: No edema, warm Neuro: Alert and oriented, No gross deficits  Assessment and plan:  # Viral illness, chills Rapid flu is negative, this was the patient's primary concern, Symptoms are mild for influenza Likely viral but, considering 7 days of symps and tender LAD I have sent a z pack to start if not improving in 2 days.  Flonase, zyrtec RTC with any concerns    Orders Placed This Encounter  Procedures  . Veritor Flu A/B Waived    Order Specific Question:   Source    Answer:   nasopharynx    Meds ordered this encounter  Medications  . fluticasone (FLONASE) 50 MCG/ACT nasal spray    Sig: Place 2 sprays into both nostrils daily.    Dispense:  16 g    Refill:  11  . azithromycin (ZITHROMAX) 250 MG tablet    Sig: Take 2 tablets on day 1 and 1 tablet daily after that    Dispense:  6 tablet    Refill:  0    Murtis SinkSam Bradshaw, MD [facility] 01/10/2017, 10:58 AM

## 2017-01-12 LAB — VERITOR FLU A/B WAIVED
INFLUENZA A: NEGATIVE
INFLUENZA B: NEGATIVE

## 2017-01-29 ENCOUNTER — Encounter: Payer: Self-pay | Admitting: Family

## 2017-02-24 ENCOUNTER — Other Ambulatory Visit: Payer: Self-pay | Admitting: Family

## 2017-03-04 ENCOUNTER — Other Ambulatory Visit: Payer: Self-pay | Admitting: Obstetrics and Gynecology

## 2017-03-22 ENCOUNTER — Other Ambulatory Visit: Payer: Self-pay | Admitting: Family

## 2017-03-22 DIAGNOSIS — K2100 Gastro-esophageal reflux disease with esophagitis, without bleeding: Secondary | ICD-10-CM

## 2017-04-24 ENCOUNTER — Other Ambulatory Visit: Payer: Self-pay | Admitting: *Deleted

## 2017-04-24 MED ORDER — HYDROCHLOROTHIAZIDE 25 MG PO TABS: 25.0000 mg | ORAL_TABLET | Freq: Every day | ORAL | 0 refills | Status: DC

## 2017-07-07 ENCOUNTER — Other Ambulatory Visit: Payer: Self-pay | Admitting: Family

## 2017-07-07 DIAGNOSIS — N951 Menopausal and female climacteric states: Secondary | ICD-10-CM

## 2017-07-07 NOTE — Telephone Encounter (Signed)
Last seen 01/10/17  Dr Ermalinda MemosBradshaw

## 2017-08-27 ENCOUNTER — Other Ambulatory Visit: Payer: Self-pay | Admitting: Family Medicine

## 2017-08-27 NOTE — Telephone Encounter (Signed)
Last seen 01/10/17  [facility]

## 2017-09-04 ENCOUNTER — Other Ambulatory Visit: Payer: Self-pay | Admitting: Family

## 2017-09-04 DIAGNOSIS — K2100 Gastro-esophageal reflux disease with esophagitis, without bleeding: Secondary | ICD-10-CM

## 2017-10-29 ENCOUNTER — Other Ambulatory Visit: Payer: Self-pay | Admitting: Family Medicine

## 2017-10-29 DIAGNOSIS — N951 Menopausal and female climacteric states: Secondary | ICD-10-CM

## 2017-11-23 ENCOUNTER — Other Ambulatory Visit: Payer: Self-pay | Admitting: Family Medicine

## 2017-11-23 DIAGNOSIS — K2100 Gastro-esophageal reflux disease with esophagitis, without bleeding: Secondary | ICD-10-CM

## 2017-11-26 ENCOUNTER — Other Ambulatory Visit: Payer: Self-pay | Admitting: Family Medicine

## 2017-11-26 DIAGNOSIS — K2100 Gastro-esophageal reflux disease with esophagitis, without bleeding: Secondary | ICD-10-CM

## 2018-01-01 ENCOUNTER — Other Ambulatory Visit: Payer: Self-pay | Admitting: Family

## 2018-01-01 DIAGNOSIS — K2100 Gastro-esophageal reflux disease with esophagitis, without bleeding: Secondary | ICD-10-CM

## 2018-01-13 ENCOUNTER — Other Ambulatory Visit: Payer: Self-pay | Admitting: Family

## 2018-01-13 DIAGNOSIS — K2100 Gastro-esophageal reflux disease with esophagitis, without bleeding: Secondary | ICD-10-CM

## 2018-01-16 ENCOUNTER — Other Ambulatory Visit: Payer: Self-pay | Admitting: Family Medicine

## 2018-01-29 ENCOUNTER — Other Ambulatory Visit: Payer: Self-pay | Admitting: Family Medicine

## 2018-01-29 DIAGNOSIS — N951 Menopausal and female climacteric states: Secondary | ICD-10-CM

## 2018-02-03 ENCOUNTER — Other Ambulatory Visit: Payer: Self-pay | Admitting: Family Medicine

## 2018-02-03 DIAGNOSIS — N951 Menopausal and female climacteric states: Secondary | ICD-10-CM

## 2018-02-17 ENCOUNTER — Telehealth: Payer: Self-pay | Admitting: Family

## 2018-02-17 DIAGNOSIS — N951 Menopausal and female climacteric states: Secondary | ICD-10-CM

## 2018-02-17 MED ORDER — PAROXETINE HCL 20 MG PO TABS: 20.0000 mg | ORAL_TABLET | Freq: Every day | ORAL | 0 refills | Status: DC

## 2018-02-17 NOTE — Telephone Encounter (Signed)
Covering PCP- please advise and send in if approved. Patient has not been seen since 01/10/17 for an acute visit.

## 2018-02-17 NOTE — Telephone Encounter (Signed)
Patient aware.

## 2018-02-17 NOTE — Telephone Encounter (Signed)
Sent in 1 month refill for the patient

## 2018-02-25 ENCOUNTER — Ambulatory Visit: Payer: 59 | Admitting: Family

## 2018-02-25 ENCOUNTER — Encounter: Payer: Self-pay | Admitting: Family

## 2018-02-25 VITALS — BP 107/66 | HR 93 | Temp 97.1°F | Ht 64.0 in | Wt 188.6 lb

## 2018-02-25 DIAGNOSIS — B002 Herpesviral gingivostomatitis and pharyngotonsillitis: Secondary | ICD-10-CM | POA: Diagnosis not present

## 2018-02-25 DIAGNOSIS — Z23 Encounter for immunization: Secondary | ICD-10-CM | POA: Diagnosis not present

## 2018-02-25 DIAGNOSIS — Z01419 Encounter for gynecological examination (general) (routine) without abnormal findings: Secondary | ICD-10-CM | POA: Diagnosis not present

## 2018-02-25 DIAGNOSIS — Z6833 Body mass index (BMI) 33.0-33.9, adult: Secondary | ICD-10-CM | POA: Diagnosis not present

## 2018-02-25 DIAGNOSIS — Z0000 Encounter for general adult medical examination without abnormal findings: Secondary | ICD-10-CM | POA: Diagnosis not present

## 2018-02-25 DIAGNOSIS — K219 Gastro-esophageal reflux disease without esophagitis: Secondary | ICD-10-CM

## 2018-02-25 DIAGNOSIS — E669 Obesity, unspecified: Secondary | ICD-10-CM | POA: Insufficient documentation

## 2018-02-25 DIAGNOSIS — Z1231 Encounter for screening mammogram for malignant neoplasm of breast: Secondary | ICD-10-CM | POA: Diagnosis not present

## 2018-02-25 DIAGNOSIS — I10 Essential (primary) hypertension: Secondary | ICD-10-CM | POA: Diagnosis not present

## 2018-02-25 DIAGNOSIS — N951 Menopausal and female climacteric states: Secondary | ICD-10-CM

## 2018-02-25 DIAGNOSIS — K2100 Gastro-esophageal reflux disease with esophagitis, without bleeding: Secondary | ICD-10-CM

## 2018-02-25 DIAGNOSIS — R69 Illness, unspecified: Secondary | ICD-10-CM | POA: Diagnosis not present

## 2018-02-25 DIAGNOSIS — F411 Generalized anxiety disorder: Secondary | ICD-10-CM

## 2018-02-25 MED ORDER — HYDROCHLOROTHIAZIDE 25 MG PO TABS: 25.0000 mg | ORAL_TABLET | Freq: Every day | ORAL | 2 refills | Status: DC

## 2018-02-25 MED ORDER — OMEPRAZOLE 20 MG PO CPDR: 20.0000 mg | DELAYED_RELEASE_CAPSULE | Freq: Every day | ORAL | 1 refills | Status: DC

## 2018-02-25 MED ORDER — PAROXETINE HCL 20 MG PO TABS: 20.0000 mg | ORAL_TABLET | Freq: Every day | ORAL | 1 refills | Status: DC

## 2018-02-25 NOTE — Progress Notes (Signed)
Subjective:    Patient ID: Kathryn Mitchell, female    DOB: 04/05/1971, 47 y.o.   MRN: [id number]  Pt presents to the office today for CPE without pap. Pt had pap and mammogram today at GYN.   Hypertension  This is a chronic problem. The current episode started more than 1 year ago. The problem has been resolved since onset. The problem is controlled. Associated symptoms include anxiety. Pertinent negatives include no malaise/fatigue, peripheral edema or shortness of breath. Risk factors for coronary artery disease include obesity and sedentary lifestyle. The current treatment provides moderate improvement. There is no history of kidney disease or CAD/MI.  Gastroesophageal Reflux  She complains of heartburn. She reports no belching or no coughing. This is a chronic problem. The current episode started more than 1 year ago. The problem occurs occasionally. The problem has been waxing and waning. Risk factors include obesity. She has tried a PPI for the symptoms. The treatment provided moderate relief.  Anxiety  Presents for follow-up visit. Symptoms include depressed mood, excessive worry, irritability, nervous/anxious behavior and restlessness. Patient reports no shortness of breath. Symptoms occur most days. The quality of sleep is good.    Oral herpes Pt taking valtrex BID for one month that was given by GYN for oral herpes outbreak.     Review of Systems  Constitutional: Positive for irritability. Negative for malaise/fatigue.  Respiratory: Negative for cough and shortness of breath.   Gastrointestinal: Positive for heartburn.  Psychiatric/Behavioral: The patient is nervous/anxious.   All other systems reviewed and are negative.      Objective:   Physical Exam  Constitutional: She is oriented to person, place, and time. She appears well-developed and well-nourished. No distress.  HENT:  Head: Normocephalic and atraumatic.  Right Ear: External ear normal.  Left Ear: External ear  normal.  Nose: Nose normal.  Mouth/Throat: Oropharynx is clear and moist.  Eyes: Pupils are equal, round, and reactive to light.  Neck: Normal range of motion. Neck supple. No thyromegaly present.  Cardiovascular: Normal rate, regular rhythm, normal heart sounds and intact distal pulses.  No murmur heard. Pulmonary/Chest: Effort normal and breath sounds normal. No respiratory distress. She has no wheezes.  Abdominal: Soft. Bowel sounds are normal. She exhibits no distension. There is no tenderness.  Musculoskeletal: Normal range of motion. She exhibits no edema or tenderness.  Neurological: She is alert and oriented to person, place, and time.  Skin: Skin is warm and dry.  Psychiatric: She has a normal mood and affect. Her behavior is normal. Judgment and thought content normal.  Vitals reviewed.     BP 107/66   Pulse 93   Temp (!) 97.1 F (36.2 C) (Oral)   Ht _0  (1.626 m)   Wt 188 lb 9.6 oz (85.5 kg)   BMI 32.37 kg/m      Assessment & Plan:  1. Annual physical exam - Anemia Profile B - CMP14+EGFR - Lipid panel - TSH - VITAMIN D 25 Hydroxy (Vit-D Deficiency, Fractures)  2. Essential hypertension, benign - CMP14+EGFR - hydrochlorothiazide (HYDRODIURIL) 25 MG tablet; Take 1 tablet (25 mg total) by mouth daily.  Dispense: 90 tablet; Refill: 2  3. Gastroesophageal reflux disease, esophagitis presence not specified - CMP14+EGFR - omeprazole (PRILOSEC) 20 MG capsule; Take 1 capsule (20 mg total) by mouth daily.  Dispense: 90 capsule; Refill: 1  4. GAD (generalized anxiety disorder) - CMP14+EGFR - PARoxetine (PAXIL) 20 MG tablet; Take 1 tablet (20 mg total) by mouth daily.  Dispense: 90 tablet; Refill: 1  5. Obesity (BMI 30-39.9) - CMP14+EGFR  6. Oral herpes  - CMP14+EGFR  7. Vasomotor symptoms due to menopause - PARoxetine (PAXIL) 20 MG tablet; Take 1 tablet (20 mg total) by mouth daily.  Dispense: 90 tablet; Refill: 1   Continue all meds Labs pending Health  Maintenance reviewed- TDAP given today, will get Pap and mammogram from [facility] so we can scan into chart Diet and exercise encouraged RTO 1 year   Evelina Dun, FNP

## 2018-02-25 NOTE — Addendum Note (Signed)
Addended by: Almeta MonasSTONE, JANIE M on: 02/25/2018 04:48 PM   Modules accepted: Orders

## 2018-02-25 NOTE — Patient Instructions (Addendum)
In a few days you may receive a survey in the mail or online from [employer] regarding your visit with us today. Please take a moment to fill this out. Your feedback is very important to our whole office. It can help us better understand your needs as well as improve your experience and satisfaction. Thank you for taking your time to complete it. We care about you.  Kathryn Potteiger, FNP  Health Maintenance, Female Adopting a healthy lifestyle and getting preventive care can go a long way to promote health and wellness. Talk with your health care provider about what schedule of regular examinations is right for you. This is a good chance for you to check in with your provider about disease prevention and staying healthy. In between checkups, there are plenty of things you can do on your own. Experts have done a lot of research about which lifestyle changes and preventive measures are most likely to keep you healthy. Ask your health care provider for more information. Weight and diet Eat a healthy diet  Be sure to include plenty of vegetables, fruits, low-fat dairy products, and lean protein.  Do not eat a lot of foods high in solid fats, added sugars, or salt.  Get regular exercise. This is one of the most important things you can do for your health. ? Most adults should exercise for at least 150 minutes each week. The exercise should increase your heart rate and make you sweat (moderate-intensity exercise). ? Most adults should also do strengthening exercises at least twice a week. This is in addition to the moderate-intensity exercise.  Maintain a healthy weight  Body mass index (BMI) is a measurement that can be used to identify possible weight problems. It estimates body fat based on height and weight. Your health care provider can help determine your BMI and help you achieve or maintain a healthy weight.  For females 20 years of age and older: ? A BMI below 18.5 is considered  underweight. ? A BMI of 18.5 to 24.9 is normal. ? A BMI of 25 to 29.9 is considered overweight. ? A BMI of 30 and above is considered obese.  Watch levels of cholesterol and blood lipids  You should start having your blood tested for lipids and cholesterol at 48 years of age, then have this test every 5 years.  You may need to have your cholesterol levels checked more often if: ? Your lipid or cholesterol levels are high. ? You are older than 48 years of age. ? You are at high risk for heart disease.  Cancer screening Lung Cancer  Lung cancer screening is recommended for adults 55-80 years old who are at high risk for lung cancer because of a history of smoking.  A yearly low-dose CT scan of the lungs is recommended for people who: ? Currently smoke. ? Have quit within the past 15 years. ? Have at least a 30-pack-year history of smoking. A pack year is smoking an average of one pack of cigarettes a day for 1 year.  Yearly screening should continue until it has been 15 years since you quit.  Yearly screening should stop if you develop a health problem that would prevent you from having lung cancer treatment.  Breast Cancer  Practice breast self-awareness. This means understanding how your breasts normally appear and feel.  It also means doing regular breast self-exams. Let your health care provider know about any changes, no matter how small.  If you are in   in your 23s or 30s, you should have a clinical breast exam (CBE) by a health care provider every 1-3 years as part of a regular health exam.  If you are 61 or older, have a CBE every year. Also consider having a breast X-ray (mammogram) every year.  If you have a family history of breast cancer, talk to your health care provider about genetic screening.  If you are at high risk for breast cancer, talk to your health care provider about having an MRI and a mammogram every year.  Breast cancer gene (BRCA) assessment is  recommended for women who have family members with BRCA-related cancers. BRCA-related cancers include: ? Breast. ? Ovarian. ? Tubal. ? Peritoneal cancers.  Results of the assessment will determine the need for genetic counseling and BRCA1 and BRCA2 testing.  Cervical Cancer Your health care provider may recommend that you be screened regularly for cancer of the pelvic organs (ovaries, uterus, and vagina). This screening involves a pelvic examination, including checking for microscopic changes to the surface of your cervix (Pap test). You may be encouraged to have this screening done every 3 years, beginning at age 38.  For women ages 27-65, health care providers may recommend pelvic exams and Pap testing every 3 years, or they may recommend the Pap and pelvic exam, combined with testing for human papilloma virus (HPV), every 5 years. Some types of HPV increase your risk of cervical cancer. Testing for HPV may also be done on women of any age with unclear Pap test results.  Other health care providers may not recommend any screening for nonpregnant women who are considered low risk for pelvic cancer and who do not have symptoms. Ask your health care provider if a screening pelvic exam is right for you.  If you have had past treatment for cervical cancer or a condition that could lead to cancer, you need Pap tests and screening for cancer for at least 20 years after your treatment. If Pap tests have been discontinued, your risk factors (such as having a new sexual partner) need to be reassessed to determine if screening should resume. Some women have medical problems that increase the chance of getting cervical cancer. In these cases, your health care provider may recommend more frequent screening and Pap tests.  Colorectal Cancer  This type of cancer can be detected and often prevented.  Routine colorectal cancer screening usually begins at 48 years of age and continues through 48 years of  age.  Your health care provider may recommend screening at an earlier age if you have risk factors for colon cancer.  Your health care provider may also recommend using home test kits to check for hidden blood in the stool.  A small camera at the end of a tube can be used to examine your colon directly (sigmoidoscopy or colonoscopy). This is done to check for the earliest forms of colorectal cancer.  Routine screening usually begins at age 59.  Direct examination of the colon should be repeated every 5-10 years through 48 years of age. However, you may need to be screened more often if early forms of precancerous polyps or small growths are found.  Skin Cancer  Check your skin from head to toe regularly.  Tell your health care provider about any new moles or changes in moles, especially if there is a change in a mole's shape or color.  Also tell your health care provider if you have a mole that is larger than the  size of a pencil eraser.  Always use sunscreen. Apply sunscreen liberally and repeatedly throughout the day.  Protect yourself by wearing long sleeves, pants, a wide-brimmed hat, and sunglasses whenever you are outside.  Heart disease, diabetes, and high blood pressure  High blood pressure causes heart disease and increases the risk of stroke. High blood pressure is more likely to develop in: ? People who have blood pressure in the high end of the normal range (130-139/85-89 mm Hg). ? People who are overweight or obese. ? People who are African American.  If you are 71-56 years of age, have your blood pressure checked every 3-5 years. If you are 48 years of age or older, have your blood pressure checked every year. You should have your blood pressure measured twice-once when you are at a hospital or clinic, and once when you are not at a hospital or clinic. Record the average of the two measurements. To check your blood pressure when you are not at a hospital or clinic, you  can use: ? An automated blood pressure machine at a pharmacy. ? A home blood pressure monitor.  If you are between 70 years and 50 years old, ask your health care provider if you should take aspirin to prevent strokes.  Have regular diabetes screenings. This involves taking a blood sample to check your fasting blood sugar level. ? If you are at a normal weight and have a low risk for diabetes, have this test once every three years after 48 years of age. ? If you are overweight and have a high risk for diabetes, consider being tested at a younger age or more often. Preventing infection Hepatitis B  If you have a higher risk for hepatitis B, you should be screened for this virus. You are considered at high risk for hepatitis B if: ? You were born in a country where hepatitis B is common. Ask your health care provider which countries are considered high risk. ? Your parents were born in a high-risk country, and you have not been immunized against hepatitis B (hepatitis B vaccine). ? You have HIV or AIDS. ? You use needles to inject street drugs. ? You live with someone who has hepatitis B. ? You have had sex with someone who has hepatitis B. ? You get hemodialysis treatment. ? You take certain medicines for conditions, including cancer, organ transplantation, and autoimmune conditions.  Hepatitis C  Blood testing is recommended for: ? Everyone born from [birth year] through 1966. ? Anyone with known risk factors for hepatitis C.  Sexually transmitted infections (STIs)  You should be screened for sexually transmitted infections (STIs) including gonorrhea and chlamydia if: ? You are sexually active and are younger than 48 years of age. ? You are older than 48 years of age and your health care provider tells you that you are at risk for this type of infection. ? Your sexual activity has changed since you were last screened and you are at an increased risk for chlamydia or gonorrhea. Ask your  health care provider if you are at risk.  If you do not have HIV, but are at risk, it may be recommended that you take a prescription medicine daily to prevent HIV infection. This is called pre-exposure prophylaxis (PrEP). You are considered at risk if: ? You are sexually active and do not regularly use condoms or know the HIV status of your partner(s). ? You take drugs by injection. ? You are sexually active with a partner  who has HIV.  Talk with your health care provider about whether you are at high risk of being infected with HIV. If you choose to begin PrEP, you should first be tested for HIV. You should then be tested every 3 months for as long as you are taking PrEP. Pregnancy  If you are premenopausal and you may become pregnant, ask your health care provider about preconception counseling.  If you may become pregnant, take 400 to 800 micrograms (mcg) of folic acid every day.  If you want to prevent pregnancy, talk to your health care provider about birth control (contraception). Osteoporosis and menopause  Osteoporosis is a disease in which the bones lose minerals and strength with aging. This can result in serious bone fractures. Your risk for osteoporosis can be identified using a bone density scan.  If you are 80 years of age or older, or if you are at risk for osteoporosis and fractures, ask your health care provider if you should be screened.  Ask your health care provider whether you should take a calcium or vitamin D supplement to lower your risk for osteoporosis.  Menopause may have certain physical symptoms and risks.  Hormone replacement therapy may reduce some of these symptoms and risks. Talk to your health care provider about whether hormone replacement therapy is right for you. Follow these instructions at home:  Schedule regular health, dental, and eye exams.  Stay current with your immunizations.  Do not use any tobacco products including cigarettes, chewing  tobacco, or electronic cigarettes.  If you are pregnant, do not drink alcohol.  If you are breastfeeding, limit how much and how often you drink alcohol.  Limit alcohol intake to no more than 1 drink per day for nonpregnant women. One drink equals 12 ounces of beer, 5 ounces of wine, or 1 ounces of hard liquor.  Do not use street drugs.  Do not share needles.  Ask your health care provider for help if you need support or information about quitting drugs.  Tell your health care provider if you often feel depressed.  Tell your health care provider if you have ever been abused or do not feel safe at home. This information is not intended to replace advice given to you by your health care provider. Make sure you discuss any questions you have with your health care provider. Document Released: 05/26/2011 Document Revised: 04/17/2016 Document Reviewed: 08/14/2015 Elsevier Interactive Patient Education  [employer].

## 2018-02-26 ENCOUNTER — Other Ambulatory Visit: Payer: Self-pay | Admitting: Family

## 2018-02-26 ENCOUNTER — Encounter: Payer: Self-pay | Admitting: Family

## 2018-02-26 DIAGNOSIS — E876 Hypokalemia: Secondary | ICD-10-CM | POA: Insufficient documentation

## 2018-02-26 DIAGNOSIS — E785 Hyperlipidemia, unspecified: Secondary | ICD-10-CM | POA: Insufficient documentation

## 2018-02-26 LAB — CMP14+EGFR
A/G RATIO: 1.4 (ref 1.2–2.2)
ALT: 13 IU/L (ref 0–32)
AST: 15 IU/L (ref 0–40)
Albumin: 4 g/dL (ref 3.5–5.5)
Alkaline Phosphatase: 95 IU/L (ref 39–117)
BUN / CREAT RATIO: 16 (ref 9–23)
BUN: 11 mg/dL (ref 6–24)
Bilirubin Total: 0.3 mg/dL (ref 0.0–1.2)
CALCIUM: 9.5 mg/dL (ref 8.7–10.2)
CHLORIDE: 101 mmol/L (ref 96–106)
CO2: 25 mmol/L (ref 20–29)
Creatinine, Ser: 0.69 mg/dL (ref 0.57–1.00)
GFR, EST AFRICAN AMERICAN: 120 mL/min/{1.73_m2} (ref >59)
GFR, EST NON AFRICAN AMERICAN: 104 mL/min/{1.73_m2} (ref >59)
GLOBULIN, TOTAL: 2.9 g/dL (ref 1.5–4.5)
Glucose: 87 mg/dL (ref 65–99)
POTASSIUM: 3.2 mmol/L — AB (ref 3.5–5.2)
SODIUM: 143 mmol/L (ref 134–144)
TOTAL PROTEIN: 6.9 g/dL (ref 6.0–8.5)

## 2018-02-26 LAB — ANEMIA PROFILE B
BASOS: 0 %
Basophils Absolute: 0 10*3/uL (ref 0.0–0.2)
EOS (ABSOLUTE): 0.1 10*3/uL (ref 0.0–0.4)
Eos: 1 %
FERRITIN: 22 ng/mL (ref 15–150)
Folate: >20 ng/mL (ref >3.0)
HEMATOCRIT: 43 % (ref 34.0–46.6)
HEMOGLOBIN: 14.6 g/dL (ref 11.1–15.9)
Immature Grans (Abs): 0 10*3/uL (ref 0.0–0.1)
Immature Granulocytes: 0 %
Iron Saturation: 13 % — ABNORMAL LOW (ref 15–55)
Iron: 47 ug/dL (ref 27–159)
Lymphocytes Absolute: 3 10*3/uL (ref 0.7–3.1)
Lymphs: 28 %
MCH: 28.9 pg (ref 26.6–33.0)
MCHC: 34 g/dL (ref 31.5–35.7)
MCV: 85 fL (ref 79–97)
MONOCYTES: 6 %
MONOS ABS: 0.6 10*3/uL (ref 0.1–0.9)
NEUTROS ABS: 7 10*3/uL (ref 1.4–7.0)
Neutrophils: 65 %
Platelets: 276 10*3/uL (ref 150–379)
RBC: 5.05 x10E6/uL (ref 3.77–5.28)
RDW: 16.6 % — AB (ref 12.3–15.4)
RETIC CT PCT: 1.2 % (ref 0.6–2.6)
Total Iron Binding Capacity: 350 ug/dL (ref 250–450)
UIBC: 303 ug/dL (ref 131–425)
VITAMIN B 12: 775 pg/mL (ref 232–1245)
WBC: 10.8 10*3/uL (ref 3.4–10.8)

## 2018-02-26 LAB — LIPID PANEL
CHOL/HDL RATIO: 4 ratio (ref 0.0–4.4)
Cholesterol, Total: 249 mg/dL — ABNORMAL HIGH (ref 100–199)
HDL: 62 mg/dL (ref >39)
LDL Calculated: 157 mg/dL — ABNORMAL HIGH (ref 0–99)
Triglycerides: 151 mg/dL — ABNORMAL HIGH (ref 0–149)
VLDL Cholesterol Cal: 30 mg/dL (ref 5–40)

## 2018-02-26 LAB — VITAMIN D 25 HYDROXY (VIT D DEFICIENCY, FRACTURES): VIT D 25 HYDROXY: 34.3 ng/mL (ref 30.0–100.0)

## 2018-02-26 LAB — TSH: TSH: 1.12 u[IU]/mL (ref 0.450–4.500)

## 2018-02-26 MED ORDER — ATORVASTATIN CALCIUM 20 MG PO TABS: 20.0000 mg | ORAL_TABLET | Freq: Every day | ORAL | 11 refills | Status: DC

## 2018-02-26 MED ORDER — POTASSIUM CHLORIDE ER 10 MEQ PO TBCR: 10.0000 meq | EXTENDED_RELEASE_TABLET | Freq: Every day | ORAL | 3 refills | Status: DC

## 2018-04-04 ENCOUNTER — Other Ambulatory Visit: Payer: Self-pay | Admitting: Family

## 2018-04-04 DIAGNOSIS — I10 Essential (primary) hypertension: Secondary | ICD-10-CM

## 2018-04-30 ENCOUNTER — Telehealth: Payer: Self-pay | Admitting: Family

## 2018-04-30 NOTE — Telephone Encounter (Signed)
Aware of results and can try OTC medications for her hot flashes and to reduce cholesterol numbers.

## 2018-05-01 DIAGNOSIS — I10 Essential (primary) hypertension: Secondary | ICD-10-CM | POA: Diagnosis not present

## 2018-05-01 DIAGNOSIS — H5200 Hypermetropia, unspecified eye: Secondary | ICD-10-CM | POA: Diagnosis not present

## 2018-05-02 ENCOUNTER — Other Ambulatory Visit: Payer: Self-pay | Admitting: Family

## 2018-05-02 DIAGNOSIS — K219 Gastro-esophageal reflux disease without esophagitis: Secondary | ICD-10-CM

## 2018-06-02 ENCOUNTER — Telehealth: Payer: Self-pay | Admitting: Family

## 2018-06-02 DIAGNOSIS — M79642 Pain in left hand: Principal | ICD-10-CM

## 2018-06-02 DIAGNOSIS — M79641 Pain in right hand: Secondary | ICD-10-CM

## 2018-06-02 DIAGNOSIS — M1990 Unspecified osteoarthritis, unspecified site: Secondary | ICD-10-CM

## 2018-06-02 NOTE — Telephone Encounter (Signed)
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

## 2018-06-02 NOTE — Telephone Encounter (Signed)
We really need to see her. I would recommend ruling out RA before doing Ortho referral. If she does not wish to be seen we can do the referral to Ortho since she was just seen a few months ago.

## 2018-06-02 NOTE — Telephone Encounter (Signed)
She prefers to go to Ortho.

## 2018-06-02 NOTE — Addendum Note (Signed)
Addended by: Jannifer RodneyHAWKS, Antionne Enrique A on: 06/02/2018 04:20 PM   Modules accepted: Orders

## 2018-06-02 NOTE — Telephone Encounter (Signed)
Prescription sent to pharmacy.

## 2018-06-15 ENCOUNTER — Other Ambulatory Visit: Payer: Self-pay | Admitting: Family

## 2018-06-15 DIAGNOSIS — I10 Essential (primary) hypertension: Secondary | ICD-10-CM

## 2018-06-17 ENCOUNTER — Other Ambulatory Visit (INDEPENDENT_AMBULATORY_CARE_PROVIDER_SITE_OTHER): Payer: 59

## 2018-06-17 ENCOUNTER — Other Ambulatory Visit: Payer: Self-pay | Admitting: Orthopedic Surgery

## 2018-06-17 DIAGNOSIS — M79642 Pain in left hand: Secondary | ICD-10-CM | POA: Diagnosis not present

## 2018-06-17 DIAGNOSIS — M79641 Pain in right hand: Secondary | ICD-10-CM | POA: Diagnosis not present

## 2018-07-27 DIAGNOSIS — Z713 Dietary counseling and surveillance: Secondary | ICD-10-CM | POA: Diagnosis not present

## 2018-08-19 DIAGNOSIS — M545 Low back pain: Secondary | ICD-10-CM | POA: Diagnosis not present

## 2018-08-19 DIAGNOSIS — M79642 Pain in left hand: Secondary | ICD-10-CM | POA: Diagnosis not present

## 2018-08-19 DIAGNOSIS — M79641 Pain in right hand: Secondary | ICD-10-CM | POA: Diagnosis not present

## 2018-08-23 ENCOUNTER — Other Ambulatory Visit: Payer: Self-pay | Admitting: Family

## 2018-08-23 DIAGNOSIS — I10 Essential (primary) hypertension: Secondary | ICD-10-CM

## 2018-09-20 ENCOUNTER — Other Ambulatory Visit: Payer: Self-pay | Admitting: Family

## 2018-09-20 DIAGNOSIS — K219 Gastro-esophageal reflux disease without esophagitis: Secondary | ICD-10-CM

## 2018-10-02 ENCOUNTER — Other Ambulatory Visit: Payer: Self-pay | Admitting: Family

## 2018-10-02 DIAGNOSIS — N951 Menopausal and female climacteric states: Secondary | ICD-10-CM

## 2018-10-02 DIAGNOSIS — M542 Cervicalgia: Secondary | ICD-10-CM | POA: Diagnosis not present

## 2018-10-02 DIAGNOSIS — F411 Generalized anxiety disorder: Secondary | ICD-10-CM

## 2018-10-02 DIAGNOSIS — M25511 Pain in right shoulder: Secondary | ICD-10-CM | POA: Diagnosis not present

## 2018-10-04 ENCOUNTER — Other Ambulatory Visit: Payer: Self-pay | Admitting: *Deleted

## 2018-10-04 ENCOUNTER — Ambulatory Visit: Payer: 59 | Admitting: Family

## 2018-10-04 MED ORDER — FLUTICASONE PROPIONATE 50 MCG/ACT NA SUSP: 2.0000 | Freq: Every day | NASAL | 1 refills | Status: DC

## 2018-10-04 NOTE — Telephone Encounter (Signed)
OV 02/25/18 rtc 1 yr

## 2018-10-22 ENCOUNTER — Other Ambulatory Visit: Payer: Self-pay | Admitting: Family

## 2018-10-22 MED ORDER — VALACYCLOVIR HCL 500 MG PO TABS: 2000.0000 mg | ORAL_TABLET | Freq: Two times a day (BID) | ORAL | 1 refills | Status: DC

## 2018-10-22 NOTE — Telephone Encounter (Signed)
Patient notified prescription was sent.

## 2018-10-22 NOTE — Telephone Encounter (Signed)
Prescription sent to pharmacy.

## 2018-10-22 NOTE — Telephone Encounter (Signed)
Is this ok to refill?  She has not gotten this prescription from our office before.

## 2018-11-16 ENCOUNTER — Other Ambulatory Visit: Payer: Self-pay | Admitting: Family

## 2018-11-16 DIAGNOSIS — I10 Essential (primary) hypertension: Secondary | ICD-10-CM

## 2018-11-18 NOTE — Telephone Encounter (Signed)
[facility]. NTBS 

## 2018-11-18 NOTE — Telephone Encounter (Signed)
Apt made with [facility].  

## 2018-12-02 ENCOUNTER — Other Ambulatory Visit: Payer: Self-pay | Admitting: Family

## 2018-12-02 DIAGNOSIS — I10 Essential (primary) hypertension: Secondary | ICD-10-CM

## 2018-12-03 NOTE — Telephone Encounter (Signed)
Ov 12/09/18 mail order

## 2018-12-09 ENCOUNTER — Other Ambulatory Visit: Payer: Self-pay | Admitting: *Deleted

## 2018-12-09 ENCOUNTER — Ambulatory Visit: Payer: 59 | Admitting: Family

## 2018-12-09 ENCOUNTER — Encounter: Payer: Self-pay | Admitting: Family

## 2018-12-09 VITALS — BP 112/67 | HR 78 | Temp 97.5°F | Ht 64.0 in | Wt 197.8 lb

## 2018-12-09 DIAGNOSIS — F411 Generalized anxiety disorder: Secondary | ICD-10-CM

## 2018-12-09 DIAGNOSIS — N951 Menopausal and female climacteric states: Secondary | ICD-10-CM

## 2018-12-09 DIAGNOSIS — B002 Herpesviral gingivostomatitis and pharyngotonsillitis: Secondary | ICD-10-CM

## 2018-12-09 DIAGNOSIS — E785 Hyperlipidemia, unspecified: Secondary | ICD-10-CM | POA: Diagnosis not present

## 2018-12-09 DIAGNOSIS — E669 Obesity, unspecified: Secondary | ICD-10-CM | POA: Diagnosis not present

## 2018-12-09 DIAGNOSIS — K219 Gastro-esophageal reflux disease without esophagitis: Secondary | ICD-10-CM

## 2018-12-09 DIAGNOSIS — I10 Essential (primary) hypertension: Secondary | ICD-10-CM

## 2018-12-09 DIAGNOSIS — R69 Illness, unspecified: Secondary | ICD-10-CM | POA: Diagnosis not present

## 2018-12-09 MED ORDER — OMEPRAZOLE 20 MG PO CPDR: 20.0000 mg | DELAYED_RELEASE_CAPSULE | Freq: Every day | ORAL | 4 refills | Status: DC

## 2018-12-09 MED ORDER — HYDROCHLOROTHIAZIDE 25 MG PO TABS: 25.0000 mg | ORAL_TABLET | Freq: Every day | ORAL | 4 refills | Status: DC

## 2018-12-09 MED ORDER — PAROXETINE HCL 20 MG PO TABS: 20.0000 mg | ORAL_TABLET | Freq: Every day | ORAL | 4 refills | Status: DC

## 2018-12-09 NOTE — Patient Instructions (Signed)

## 2018-12-09 NOTE — Progress Notes (Signed)
Subjective:    Patient ID: Kathryn Mitchell, female    DOB: 1971/07/13, 48 y.o.   MRN: [id number]  Chief Complaint  Patient presents with  . Medical Management of Chronic Issues    refills, lab work   PT presents to the office today for chronic follow up.  Hypertension  This is a chronic problem. The current episode started more than 1 year ago. The problem has been resolved since onset. The problem is controlled. Associated symptoms include anxiety and malaise/fatigue. Pertinent negatives include no headaches, peripheral edema or shortness of breath. Risk factors for coronary artery disease include dyslipidemia and obesity. The current treatment provides moderate improvement. There is no history of kidney disease, CAD/MI or heart failure.  Gastroesophageal Reflux  She complains of a sore throat. She reports no belching, no coughing or no heartburn. This is a chronic problem. The current episode started more than 1 year ago. The problem occurs occasionally. The problem has been waxing and waning. She has tried a PPI for the symptoms. The treatment provided moderate relief.  Hyperlipidemia  This is a chronic problem. The current episode started more than 1 year ago. The problem is uncontrolled. Recent lipid tests were reviewed and are high. Exacerbating diseases include obesity. Pertinent negatives include no shortness of breath. Current antihyperlipidemic treatment includes statins. The current treatment provides moderate improvement of lipids. Risk factors for coronary artery disease include dyslipidemia, hypertension and a sedentary lifestyle.  Anxiety  Presents for follow-up visit. Symptoms include decreased concentration, excessive worry, irritability and nervous/anxious behavior. Patient reports no shortness of breath. Symptoms occur occasionally. The severity of symptoms is moderate.    URI   This is a new problem. The current episode started yesterday. The problem has been waxing and  waning. There has been no fever. Associated symptoms include congestion and a sore throat. Pertinent negatives include no coughing or headaches.  Oral Herpes Takes valtrex as needed.     Review of Systems  Constitutional: Positive for irritability and malaise/fatigue.  HENT: Positive for congestion and sore throat.   Respiratory: Negative for cough and shortness of breath.   Gastrointestinal: Negative for heartburn.  Neurological: Negative for headaches.  Psychiatric/Behavioral: Positive for decreased concentration. The patient is nervous/anxious.   All other systems reviewed and are negative.      Objective:   Physical Exam Vitals signs reviewed.  Constitutional:      General: She is not in acute distress.    Appearance: She is well-developed.  HENT:     Head: Normocephalic and atraumatic.     Right Ear: External ear normal.     Nose: Mucosal edema present.     Mouth/Throat:     Pharynx: Posterior oropharyngeal erythema present.  Eyes:     Pupils: Pupils are equal, round, and reactive to light.  Neck:     Musculoskeletal: Normal range of motion and neck supple.     Thyroid: No thyromegaly.  Cardiovascular:     Rate and Rhythm: Normal rate and regular rhythm.     Heart sounds: Normal heart sounds. No murmur.  Pulmonary:     Effort: Pulmonary effort is normal. No respiratory distress.     Breath sounds: Normal breath sounds. No wheezing.  Abdominal:     General: Bowel sounds are normal. There is no distension.     Palpations: Abdomen is soft.     Tenderness: There is no abdominal tenderness.  Musculoskeletal: Normal range of motion.  General: No tenderness.  Skin:    General: Skin is warm and dry.  Neurological:     Mental Status: She is alert and oriented to person, place, and time.     Cranial Nerves: No cranial nerve deficit.     Deep Tendon Reflexes: Reflexes are normal and symmetric.  Psychiatric:        Behavior: Behavior normal.        Thought  Content: Thought content normal.        Judgment: Judgment normal.     BP 112/67   Pulse 78   Temp (!) 97.5 F (36.4 C) (Oral)   Ht _0  (1.626 m)   Wt 197 lb 12.8 oz (89.7 kg)   BMI 33.95 kg/m      Assessment & Plan:  Lichelle Viets Railsback comes in today with chief complaint of Medical Management of Chronic Issues (refills, lab work)   Diagnosis and orders addressed:  1. Essential hypertension, benign - CMP14+EGFR - CBC with Differential/Platelet - hydrochlorothiazide (HYDRODIURIL) 25 MG tablet; Take 1 tablet (25 mg total) by mouth daily.  Dispense: 90 tablet; Refill: 4  2. Gastroesophageal reflux disease, esophagitis presence not specified - CMP14+EGFR - CBC with Differential/Platelet - omeprazole (PRILOSEC) 20 MG capsule; Take 1 capsule (20 mg total) by mouth daily.  Dispense: 90 capsule; Refill: 4  3. Oral herpes - CMP14+EGFR - CBC with Differential/Platelet  4. GAD (generalized anxiety disorder) - CMP14+EGFR - CBC with Differential/Platelet - PARoxetine (PAXIL) 20 MG tablet; Take 1 tablet (20 mg total) by mouth daily.  Dispense: 90 tablet; Refill: 4  5. Obesity (BMI 30-39.9) - CMP14+EGFR - CBC with Differential/Platelet  6. Hyperlipidemia, unspecified hyperlipidemia type - CMP14+EGFR - CBC with Differential/Platelet - Lipid panel  7. 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  Dispense: 90 tablet; Refill: 4   Labs pending Health Maintenance reviewed Diet and exercise encouraged  Follow up plan: 6 months    Evelina Dun, FNP

## 2018-12-10 ENCOUNTER — Telehealth: Payer: Self-pay | Admitting: Family

## 2018-12-10 LAB — CBC WITH DIFFERENTIAL/PLATELET
BASOS: 1 %
Basophils Absolute: 0 10*3/uL (ref 0.0–0.2)
EOS (ABSOLUTE): 0.1 10*3/uL (ref 0.0–0.4)
Eos: 3 %
HEMATOCRIT: 41.5 % (ref 34.0–46.6)
HEMOGLOBIN: 14.5 g/dL (ref 11.1–15.9)
IMMATURE GRANS (ABS): 0 10*3/uL (ref 0.0–0.1)
Immature Granulocytes: 0 %
LYMPHS ABS: 1.4 10*3/uL (ref 0.7–3.1)
LYMPHS: 36 %
MCH: 30.3 pg (ref 26.6–33.0)
MCHC: 34.9 g/dL (ref 31.5–35.7)
MCV: 87 fL (ref 79–97)
MONOCYTES: 10 %
Monocytes Absolute: 0.4 10*3/uL (ref 0.1–0.9)
NEUTROS ABS: 1.8 10*3/uL (ref 1.4–7.0)
Neutrophils: 50 %
Platelets: 237 10*3/uL (ref 150–450)
RBC: 4.78 x10E6/uL (ref 3.77–5.28)
RDW: 13.5 % (ref 11.7–15.4)
WBC: 3.7 10*3/uL (ref 3.4–10.8)

## 2018-12-10 LAB — CMP14+EGFR
ALBUMIN: 3.7 g/dL (ref 3.5–5.5)
ALT: 17 IU/L (ref 0–32)
AST: 18 IU/L (ref 0–40)
Albumin/Globulin Ratio: 1.4 (ref 1.2–2.2)
Alkaline Phosphatase: 111 IU/L (ref 39–117)
BILIRUBIN TOTAL: 0.3 mg/dL (ref 0.0–1.2)
BUN / CREAT RATIO: 14 (ref 9–23)
BUN: 10 mg/dL (ref 6–24)
CALCIUM: 9 mg/dL (ref 8.7–10.2)
CO2: 27 mmol/L (ref 20–29)
Chloride: 102 mmol/L (ref 96–106)
Creatinine, Ser: 0.73 mg/dL (ref 0.57–1.00)
GFR, EST AFRICAN AMERICAN: 113 mL/min/{1.73_m2} (ref >59)
GFR, EST NON AFRICAN AMERICAN: 98 mL/min/{1.73_m2} (ref >59)
GLUCOSE: 93 mg/dL (ref 65–99)
Globulin, Total: 2.6 g/dL (ref 1.5–4.5)
Potassium: 4 mmol/L (ref 3.5–5.2)
Sodium: 143 mmol/L (ref 134–144)
TOTAL PROTEIN: 6.3 g/dL (ref 6.0–8.5)

## 2018-12-10 LAB — LIPID PANEL
CHOL/HDL RATIO: 4.7 ratio — AB (ref 0.0–4.4)
CHOLESTEROL TOTAL: 252 mg/dL — AB (ref 100–199)
HDL: 54 mg/dL (ref >39)
LDL CALC: 175 mg/dL — AB (ref 0–99)
Triglycerides: 116 mg/dL (ref 0–149)
VLDL CHOLESTEROL CAL: 23 mg/dL (ref 5–40)

## 2018-12-10 MED ORDER — AZITHROMYCIN 250 MG PO TABS: ORAL_TABLET | ORAL | 0 refills | Status: DC

## 2018-12-10 NOTE — Telephone Encounter (Signed)
Detailed message left for patient.

## 2018-12-10 NOTE — Telephone Encounter (Signed)
Zpak Prescription sent to pharmacy   

## 2018-12-10 NOTE — Telephone Encounter (Signed)
Patient was called .

## 2018-12-10 NOTE — Telephone Encounter (Signed)
What symptoms do you have? Coughing - pt was seen yesterday and Neysa Bonito advised her to call back if she starts coughing.  How long have you been sick? 3 days  Have you been seen for this problem? yes  If your provider decides to give you a prescription, which pharmacy would you like for it to be [facility] to? [facility]   Patient informed that this information will be sent to the clinical staff for review and that they should receive a follow up call.

## 2019-03-01 DIAGNOSIS — Z1231 Encounter for screening mammogram for malignant neoplasm of breast: Secondary | ICD-10-CM | POA: Diagnosis not present

## 2019-03-01 DIAGNOSIS — Z6835 Body mass index (BMI) 35.0-35.9, adult: Secondary | ICD-10-CM | POA: Diagnosis not present

## 2019-03-01 DIAGNOSIS — Z01419 Encounter for gynecological examination (general) (routine) without abnormal findings: Secondary | ICD-10-CM | POA: Diagnosis not present

## 2019-05-18 ENCOUNTER — Other Ambulatory Visit: Payer: Self-pay | Admitting: Family

## 2019-05-18 DIAGNOSIS — K219 Gastro-esophageal reflux disease without esophagitis: Secondary | ICD-10-CM

## 2019-05-18 DIAGNOSIS — I10 Essential (primary) hypertension: Secondary | ICD-10-CM

## 2019-05-18 DIAGNOSIS — N951 Menopausal and female climacteric states: Secondary | ICD-10-CM

## 2019-05-18 DIAGNOSIS — F411 Generalized anxiety disorder: Secondary | ICD-10-CM

## 2019-05-18 MED ORDER — HYDROCHLOROTHIAZIDE 25 MG PO TABS: 25.0000 mg | ORAL_TABLET | Freq: Every day | ORAL | 3 refills | Status: DC

## 2019-05-18 MED ORDER — OMEPRAZOLE 20 MG PO CPDR: 20.0000 mg | DELAYED_RELEASE_CAPSULE | Freq: Every day | ORAL | 3 refills | Status: DC

## 2019-05-18 MED ORDER — PAROXETINE HCL 20 MG PO TABS: 20.0000 mg | ORAL_TABLET | Freq: Every day | ORAL | 3 refills | Status: DC

## 2019-05-18 NOTE — Telephone Encounter (Signed)
What is the name of the medication? Paroxetine  Have you contacted your pharmacy to request a refill? Yes  Which pharmacy would you like this sent to? [facility]   Patient notified that their request is being sent to the clinical staff for review and that they should receive a call once it is complete. If they do not receive a call within 24 hours they can check with their pharmacy or our office.

## 2019-05-18 NOTE — Telephone Encounter (Signed)
[facility] due to [facility]. [facility] and [facility] is aware.

## 2019-06-10 ENCOUNTER — Encounter: Payer: Self-pay | Admitting: Family

## 2019-06-10 ENCOUNTER — Ambulatory Visit (INDEPENDENT_AMBULATORY_CARE_PROVIDER_SITE_OTHER): Payer: 59 | Admitting: Family

## 2019-06-10 DIAGNOSIS — E669 Obesity, unspecified: Secondary | ICD-10-CM

## 2019-06-10 DIAGNOSIS — B002 Herpesviral gingivostomatitis and pharyngotonsillitis: Secondary | ICD-10-CM | POA: Diagnosis not present

## 2019-06-10 DIAGNOSIS — K219 Gastro-esophageal reflux disease without esophagitis: Secondary | ICD-10-CM

## 2019-06-10 DIAGNOSIS — E785 Hyperlipidemia, unspecified: Secondary | ICD-10-CM | POA: Diagnosis not present

## 2019-06-10 DIAGNOSIS — I10 Essential (primary) hypertension: Secondary | ICD-10-CM

## 2019-06-10 DIAGNOSIS — F411 Generalized anxiety disorder: Secondary | ICD-10-CM

## 2019-06-10 DIAGNOSIS — N951 Menopausal and female climacteric states: Secondary | ICD-10-CM | POA: Diagnosis not present

## 2019-06-10 DIAGNOSIS — R69 Illness, unspecified: Secondary | ICD-10-CM | POA: Diagnosis not present

## 2019-06-10 MED ORDER — PAROXETINE HCL 20 MG PO TABS: 20.0000 mg | ORAL_TABLET | Freq: Every day | ORAL | 3 refills | Status: DC

## 2019-06-10 MED ORDER — HYDROCHLOROTHIAZIDE 25 MG PO TABS: 25.0000 mg | ORAL_TABLET | Freq: Every day | ORAL | 3 refills | Status: DC

## 2019-06-10 MED ORDER — OMEPRAZOLE 20 MG PO CPDR: 20.0000 mg | DELAYED_RELEASE_CAPSULE | Freq: Every day | ORAL | 3 refills | Status: DC

## 2019-06-10 NOTE — Progress Notes (Signed)
Virtual Visit via telephone Note  I connected with Kathryn Mitchell on 06/10/19 at 8:09 AM by telephone and verified that I am speaking with the correct person using two identifiers. Kathryn Mitchell is currently located at home and no one is currently with her during visit. The provider, Evelina Dun, FNP is located in their office at time of visit.  I discussed the limitations, risks, security and privacy concerns of performing an evaluation and management service by telephone and the availability of in person appointments. I also discussed with the patient that there may be a patient responsible charge related to this service. The patient expressed understanding and agreed to proceed.   History and Present Illness:  PT calls the office today for chronic follow up.  Hypertension This is a chronic problem. The current episode started more than 1 year ago. The problem has been resolved (118/78) since onset. The problem is controlled. Associated symptoms include anxiety. Pertinent negatives include no malaise/fatigue, peripheral edema or shortness of breath. Risk factors for coronary artery disease include dyslipidemia and sedentary lifestyle. The current treatment provides moderate improvement. There is no history of kidney disease, CAD/MI, CVA or heart failure.  Hyperlipidemia This is a chronic problem. The current episode started more than 1 year ago. The problem is uncontrolled. Recent lipid tests were reviewed and are high. Pertinent negatives include no shortness of breath. Current antihyperlipidemic treatment includes diet change. The current treatment provides mild improvement of lipids. Risk factors for coronary artery disease include dyslipidemia, hypertension and a sedentary lifestyle.  Gastroesophageal Reflux She complains of belching and heartburn. She reports no coughing. This is a chronic problem. The current episode started more than 1 year ago. The problem occurs rarely. The problem  has been waxing and waning. The symptoms are aggravated by certain foods. She has tried a PPI for the symptoms. The treatment provided moderate relief.  Anxiety Presents for follow-up visit. Symptoms include decreased concentration, depressed mood, excessive worry, insomnia, irritability and nervous/anxious behavior. Patient reports no shortness of breath. Symptoms occur occasionally. The severity of symptoms is moderate.    Oral Herpes Takes Valtrex as needed. Has been months since her last outbreak.     Review of Systems  Constitutional: Positive for irritability. Negative for malaise/fatigue.  Respiratory: Negative for cough and shortness of breath.   Gastrointestinal: Positive for heartburn.  Psychiatric/Behavioral: Positive for decreased concentration. The patient is nervous/anxious and has insomnia.   All other systems reviewed and are negative.    Observations/Objective: No SOB or distress noted   Assessment and Plan: Kathryn Mitchell comes in today with chief complaint of No chief complaint on file.   Diagnosis and orders addressed:  1. Essential hypertension, benign - CMP14+EGFR; Future - CBC with Differential/Platelet; Future - hydrochlorothiazide (HYDRODIURIL) 25 MG tablet; Take 1 tablet (25 mg total) by mouth daily.  Dispense: 90 tablet; Refill: 3  2. Gastroesophageal reflux disease, esophagitis presence not specified - CMP14+EGFR; Future - CBC with Differential/Platelet; Future - omeprazole (PRILOSEC) 20 MG capsule; Take 1 capsule (20 mg total) by mouth daily.  Dispense: 90 capsule; Refill: 3  3. Oral herpes - CMP14+EGFR; Future - CBC with Differential/Platelet; Future  4. Obesity (BMI 30-39.9) - CMP14+EGFR; Future - CBC with Differential/Platelet; Future  5. Hyperlipidemia, unspecified hyperlipidemia type - CMP14+EGFR; Future - CBC with Differential/Platelet; Future - Lipid panel; Future  6. GAD (generalized anxiety disorder) - CMP14+EGFR; Future -  CBC with Differential/Platelet; Future - PARoxetine (PAXIL) 20 MG tablet; Take 1  tablet (20 mg total) by mouth daily.  Dispense: 90 tablet; Refill: 3  7. Vasomotor symptoms due to menopause - CMP14+EGFR; Future - CBC with Differential/Platelet; Future - PARoxetine (PAXIL) 20 MG tablet; Take 1 tablet (20 mg total) by mouth daily.  Dispense: 90 tablet; Refill: 3   Labs pending Health Maintenance reviewed Diet and exercise encouraged  Follow up plan: 6 months     I discussed the assessment and treatment plan with the patient. The patient was provided an opportunity to ask questions and all were answered. The patient agreed with the plan and demonstrated an understanding of the instructions.   The patient was advised to call back or seek an in-person evaluation if the symptoms worsen or if the condition fails to improve as anticipated.  The above assessment and management plan was discussed with the patient. The patient verbalized understanding of and has agreed to the management plan. Patient is aware to call the clinic if symptoms persist or worsen. Patient is aware when to return to the clinic for a follow-up visit. Patient educated on when it is appropriate to go to the emergency department.   Time call ended:  8:23 AM  I provided 14 minutes of non-face-to-face time during this encounter.    Evelina Dun, FNP

## 2019-06-22 ENCOUNTER — Other Ambulatory Visit: Payer: 59

## 2019-06-22 ENCOUNTER — Other Ambulatory Visit: Payer: Self-pay

## 2019-06-22 DIAGNOSIS — R69 Illness, unspecified: Secondary | ICD-10-CM | POA: Diagnosis not present

## 2019-06-22 DIAGNOSIS — E669 Obesity, unspecified: Secondary | ICD-10-CM

## 2019-06-22 DIAGNOSIS — B002 Herpesviral gingivostomatitis and pharyngotonsillitis: Secondary | ICD-10-CM | POA: Diagnosis not present

## 2019-06-22 DIAGNOSIS — E785 Hyperlipidemia, unspecified: Secondary | ICD-10-CM

## 2019-06-22 DIAGNOSIS — N951 Menopausal and female climacteric states: Secondary | ICD-10-CM

## 2019-06-22 DIAGNOSIS — K219 Gastro-esophageal reflux disease without esophagitis: Secondary | ICD-10-CM | POA: Diagnosis not present

## 2019-06-22 DIAGNOSIS — F411 Generalized anxiety disorder: Secondary | ICD-10-CM

## 2019-06-22 DIAGNOSIS — I10 Essential (primary) hypertension: Secondary | ICD-10-CM

## 2019-06-23 LAB — CMP14+EGFR
ALT: 15 IU/L (ref 0–32)
AST: 17 IU/L (ref 0–40)
Albumin/Globulin Ratio: 1.6 (ref 1.2–2.2)
Albumin: 4.1 g/dL (ref 3.8–4.8)
Alkaline Phosphatase: 104 IU/L (ref 39–117)
BUN/Creatinine Ratio: 11 (ref 9–23)
BUN: 9 mg/dL (ref 6–24)
Bilirubin Total: 0.4 mg/dL (ref 0.0–1.2)
CO2: 25 mmol/L (ref 20–29)
Calcium: 9.4 mg/dL (ref 8.7–10.2)
Chloride: 100 mmol/L (ref 96–106)
Creatinine, Ser: 0.82 mg/dL (ref 0.57–1.00)
GFR calc Af Amer: 97 mL/min/{1.73_m2} (ref >59)
GFR calc non Af Amer: 84 mL/min/{1.73_m2} (ref >59)
Globulin, Total: 2.6 g/dL (ref 1.5–4.5)
Glucose: 94 mg/dL (ref 65–99)
Potassium: 3.8 mmol/L (ref 3.5–5.2)
Sodium: 143 mmol/L (ref 134–144)
Total Protein: 6.7 g/dL (ref 6.0–8.5)

## 2019-06-23 LAB — CBC WITH DIFFERENTIAL/PLATELET
Basophils Absolute: 0 10*3/uL (ref 0.0–0.2)
Basos: 1 %
EOS (ABSOLUTE): 0.1 10*3/uL (ref 0.0–0.4)
Eos: 2 %
Hematocrit: 44.6 % (ref 34.0–46.6)
Hemoglobin: 15.4 g/dL (ref 11.1–15.9)
Immature Grans (Abs): 0 10*3/uL (ref 0.0–0.1)
Immature Granulocytes: 0 %
Lymphocytes Absolute: 2.3 10*3/uL (ref 0.7–3.1)
Lymphs: 30 %
MCH: 29.7 pg (ref 26.6–33.0)
MCHC: 34.5 g/dL (ref 31.5–35.7)
MCV: 86 fL (ref 79–97)
Monocytes Absolute: 0.3 10*3/uL (ref 0.1–0.9)
Monocytes: 4 %
Neutrophils Absolute: 5 10*3/uL (ref 1.4–7.0)
Neutrophils: 63 %
Platelets: 295 10*3/uL (ref 150–450)
RBC: 5.18 x10E6/uL (ref 3.77–5.28)
RDW: 13.6 % (ref 11.7–15.4)
WBC: 7.9 10*3/uL (ref 3.4–10.8)

## 2019-06-23 LAB — LIPID PANEL
Chol/HDL Ratio: 4.6 ratio — ABNORMAL HIGH (ref 0.0–4.4)
Cholesterol, Total: 262 mg/dL — ABNORMAL HIGH (ref 100–199)
HDL: 57 mg/dL (ref >39)
LDL Calculated: 171 mg/dL — ABNORMAL HIGH (ref 0–99)
Triglycerides: 171 mg/dL — ABNORMAL HIGH (ref 0–149)
VLDL Cholesterol Cal: 34 mg/dL (ref 5–40)

## 2019-07-17 ENCOUNTER — Other Ambulatory Visit: Payer: Self-pay | Admitting: Family

## 2019-08-13 DIAGNOSIS — M5432 Sciatica, left side: Secondary | ICD-10-CM | POA: Diagnosis not present

## 2019-09-30 ENCOUNTER — Other Ambulatory Visit: Payer: Self-pay

## 2019-09-30 DIAGNOSIS — Z20822 Contact with and (suspected) exposure to covid-19: Secondary | ICD-10-CM

## 2019-10-01 LAB — NOVEL CORONAVIRUS, NAA: SARS-CoV-2, NAA: NOT DETECTED

## 2019-11-01 ENCOUNTER — Ambulatory Visit (INDEPENDENT_AMBULATORY_CARE_PROVIDER_SITE_OTHER): Payer: 59 | Admitting: Family

## 2019-11-01 ENCOUNTER — Encounter: Payer: Self-pay | Admitting: Family

## 2019-11-01 DIAGNOSIS — F411 Generalized anxiety disorder: Secondary | ICD-10-CM | POA: Diagnosis not present

## 2019-11-01 DIAGNOSIS — F410 Panic disorder [episodic paroxysmal anxiety] without agoraphobia: Secondary | ICD-10-CM

## 2019-11-01 DIAGNOSIS — R69 Illness, unspecified: Secondary | ICD-10-CM | POA: Diagnosis not present

## 2019-11-01 MED ORDER — BUSPIRONE HCL 5 MG PO TABS: 5.0000 mg | ORAL_TABLET | Freq: Three times a day (TID) | ORAL | 1 refills | Status: DC | PRN

## 2019-11-01 MED ORDER — PAROXETINE HCL 40 MG PO TABS: 40.0000 mg | ORAL_TABLET | ORAL | 1 refills | Status: DC

## 2019-11-01 NOTE — Progress Notes (Signed)
Virtual Visit via telephone Note Due to COVID-19 pandemic this visit was conducted virtually. This visit type was conducted due to national recommendations for restrictions regarding the COVID-19 Pandemic (e.g. social distancing, sheltering in place) in an effort to limit this patient's exposure and mitigate transmission in our community. All issues noted in this document were discussed and addressed.  A physical exam was not performed with this format.  I connected with Kathryn Mitchell on 11/01/19 at 8:48 AM  by telephone and verified that I am speaking with the correct person using two identifiers. Kathryn Mitchell is currently located at home and no one is currently with her during visit. The provider, Evelina Dun, FNP is located in their office at time of visit.  I discussed the limitations, risks, security and privacy concerns of performing an evaluation and management service by telephone and the availability of in person appointments. I also discussed with the patient that there may be a patient responsible charge related to this service. The patient expressed understanding and agreed to proceed.   History and Present Illness:  Pt calls the office today with panic attacks. She states her husband left her in September on drugs and then returned last week but was "out there' and had to call EMS. He is currently in the hospital unresponsive.  She states she is worried he is going to die. Anxiety Presents for follow-up visit. Symptoms include decreased concentration, depressed mood, excessive worry, irritability, nervous/anxious behavior, panic and restlessness. Symptoms occur most days. The severity of symptoms is moderate. The quality of sleep is good.         Review of Systems  Constitutional: Positive for irritability.  Psychiatric/Behavioral: Positive for decreased concentration. The patient is nervous/anxious.      Observations/Objective: No SOB or distress noted  Assessment  and Plan: 1. GAD (generalized anxiety disorder) Will increase Paxil to 40 mg from 20 mg  Will start Buspar 5 mg TID prn  Stress management  Call if Buspar needs to be increased  - PARoxetine (PAXIL) 40 MG tablet; Take 1 tablet (40 mg total) by mouth every morning.  Dispense: 90 tablet; Refill: 1 - busPIRone (BUSPAR) 5 MG tablet; Take 1 tablet (5 mg total) by mouth 3 (three) times daily as needed.  Dispense: 90 tablet; Refill: 1  2. Panic attacks - PARoxetine (PAXIL) 40 MG tablet; Take 1 tablet (40 mg total) by mouth every morning.  Dispense: 90 tablet; Refill: 1 - busPIRone (BUSPAR) 5 MG tablet; Take 1 tablet (5 mg total) by mouth 3 (three) times daily as needed.  Dispense: 90 tablet; Refill: 1     I discussed the assessment and treatment plan with the patient. The patient was provided an opportunity to ask questions and all were answered. The patient agreed with the plan and demonstrated an understanding of the instructions.   The patient was advised to call back or seek an in-person evaluation if the symptoms worsen or if the condition fails to improve as anticipated.  The above assessment and management plan was discussed with the patient. The patient verbalized understanding of and has agreed to the management plan. Patient is aware to call the clinic if symptoms persist or worsen. Patient is aware when to return to the clinic for a follow-up visit. Patient educated on when it is appropriate to go to the emergency department.   Time call ended:  9:05  I provided 17 minutes of non-face-to-face time during this encounter.    [employer]  Kathryn Mitchell, [place]

## 2019-11-14 ENCOUNTER — Encounter: Payer: Self-pay | Admitting: Family

## 2019-11-14 ENCOUNTER — Ambulatory Visit (INDEPENDENT_AMBULATORY_CARE_PROVIDER_SITE_OTHER): Payer: 59 | Admitting: Family

## 2019-11-14 DIAGNOSIS — M5441 Lumbago with sciatica, right side: Secondary | ICD-10-CM | POA: Diagnosis not present

## 2019-11-14 MED ORDER — DICLOFENAC SODIUM 75 MG PO TBEC: 75.0000 mg | DELAYED_RELEASE_TABLET | Freq: Two times a day (BID) | ORAL | 2 refills | Status: DC

## 2019-11-14 MED ORDER — PREDNISONE 10 MG (21) PO TBPK: ORAL_TABLET | ORAL | 0 refills | Status: DC

## 2019-11-14 NOTE — Progress Notes (Signed)
Virtual Visit via telephone Note Due to COVID-19 pandemic this visit was conducted virtually. This visit type was conducted due to national recommendations for restrictions regarding the COVID-19 Pandemic (e.g. social distancing, sheltering in place) in an effort to limit this patient's exposure and mitigate transmission in our community. All issues noted in this document were discussed and addressed.  A physical exam was not performed with this format.  I connected with Kathryn Mitchell on 11/14/19 at 2:01 pm by telephone and verified that I am speaking with the correct person using two identifiers. Kathryn Mitchell is currently located at work and no one is currently with her during visit. The provider, Evelina Dun, FNP is located in their office at time of visit.  I discussed the limitations, risks, security and privacy concerns of performing an evaluation and management service by telephone and the availability of in person appointments. I also discussed with the patient that there may be a patient responsible charge related to this service. The patient expressed understanding and agreed to proceed.   History and Present Illness:  Back Pain This is a recurrent problem. The problem occurs intermittently. The problem has been waxing and waning since onset. The pain is present in the gluteal. The quality of the pain is described as aching. The pain radiates to the right thigh and right knee. The pain is at a severity of 10/10 (at night). The pain is moderate. The symptoms are aggravated by lying down. Stiffness is present at night. Associated symptoms include leg pain. Pertinent negatives include no bladder incontinence, bowel incontinence, dysuria, fever, headaches or paresis. She has tried bed rest and NSAIDs for the symptoms. The treatment provided mild relief.      Review of Systems  Constitutional: Negative for fever.  Gastrointestinal: Negative for bowel incontinence.  Genitourinary:  Negative for bladder incontinence and dysuria.  Musculoskeletal: Positive for back pain.  Neurological: Negative for headaches.  All other systems reviewed and are negative.    Observations/Objective: No SOB or distress noted   Assessment and Plan: 1. Acute right-sided low back pain with right-sided sciatica Rest ROM exercises  Referral PT  No other NSAID's while taking diclofenac  Call if symptoms worsen or do not improve  Can not prescribe controlled medication over phone for new starts - predniSONE (STERAPRED UNI-PAK 21 TAB) 10 MG (21) TBPK tablet; Use as directed  Dispense: 21 tablet; Refill: 0 - diclofenac (VOLTAREN) 75 MG EC tablet; Take 1 tablet (75 mg total) by mouth 2 (two) times daily.  Dispense: 60 tablet; Refill: 2 - Ambulatory referral to Physical Therapy    I discussed the assessment and treatment plan with the patient. The patient was provided an opportunity to ask questions and all were answered. The patient agreed with the plan and demonstrated an understanding of the instructions.   The patient was advised to call back or seek an in-person evaluation if the symptoms worsen or if the condition fails to improve as anticipated.  The above assessment and management plan was discussed with the patient. The patient verbalized understanding of and has agreed to the management plan. Patient is aware to call the clinic if symptoms persist or worsen. Patient is aware when to return to the clinic for a follow-up visit. Patient educated on when it is appropriate to go to the emergency department.   Time call ended:  2:10 pm   I provided 9 minutes of non-face-to-face time during this encounter.    Evelina Dun, FNP

## 2019-11-23 ENCOUNTER — Other Ambulatory Visit: Payer: Self-pay | Admitting: Family

## 2019-11-23 DIAGNOSIS — F411 Generalized anxiety disorder: Secondary | ICD-10-CM

## 2019-11-23 DIAGNOSIS — F410 Panic disorder [episodic paroxysmal anxiety] without agoraphobia: Secondary | ICD-10-CM

## 2019-12-08 ENCOUNTER — Other Ambulatory Visit: Payer: Self-pay

## 2019-12-09 ENCOUNTER — Ambulatory Visit (INDEPENDENT_AMBULATORY_CARE_PROVIDER_SITE_OTHER): Payer: 59 | Admitting: *Deleted

## 2019-12-09 DIAGNOSIS — Z23 Encounter for immunization: Secondary | ICD-10-CM | POA: Diagnosis not present

## 2019-12-30 ENCOUNTER — Other Ambulatory Visit: Payer: Self-pay | Admitting: Family

## 2019-12-30 DIAGNOSIS — F410 Panic disorder [episodic paroxysmal anxiety] without agoraphobia: Secondary | ICD-10-CM

## 2019-12-30 DIAGNOSIS — F411 Generalized anxiety disorder: Secondary | ICD-10-CM

## 2020-03-21 ENCOUNTER — Other Ambulatory Visit: Payer: Self-pay | Admitting: Family

## 2020-03-21 DIAGNOSIS — M5441 Lumbago with sciatica, right side: Secondary | ICD-10-CM

## 2020-04-02 ENCOUNTER — Other Ambulatory Visit: Payer: Self-pay | Admitting: Family

## 2020-04-02 DIAGNOSIS — F410 Panic disorder [episodic paroxysmal anxiety] without agoraphobia: Secondary | ICD-10-CM

## 2020-04-02 DIAGNOSIS — F411 Generalized anxiety disorder: Secondary | ICD-10-CM

## 2020-05-16 ENCOUNTER — Telehealth: Payer: Self-pay | Admitting: Family

## 2020-05-16 NOTE — Telephone Encounter (Signed)
Pt states that her new insurance requires her to use mail order pharmacies. She is wanting to see if all rx's can be sent to [facility].

## 2020-05-16 NOTE — Telephone Encounter (Signed)
Appt made

## 2020-05-22 ENCOUNTER — Other Ambulatory Visit: Payer: Self-pay

## 2020-05-22 ENCOUNTER — Encounter: Payer: Self-pay | Admitting: Family

## 2020-05-22 ENCOUNTER — Ambulatory Visit: Payer: 59 | Admitting: Family

## 2020-05-22 VITALS — BP 108/69 | HR 59 | Temp 98.2°F | Ht 63.0 in | Wt 193.2 lb

## 2020-05-22 DIAGNOSIS — I10 Essential (primary) hypertension: Secondary | ICD-10-CM

## 2020-05-22 DIAGNOSIS — K219 Gastro-esophageal reflux disease without esophagitis: Secondary | ICD-10-CM | POA: Diagnosis not present

## 2020-05-22 DIAGNOSIS — E669 Obesity, unspecified: Secondary | ICD-10-CM

## 2020-05-22 DIAGNOSIS — F411 Generalized anxiety disorder: Secondary | ICD-10-CM | POA: Diagnosis not present

## 2020-05-22 DIAGNOSIS — F410 Panic disorder [episodic paroxysmal anxiety] without agoraphobia: Secondary | ICD-10-CM | POA: Diagnosis not present

## 2020-05-22 DIAGNOSIS — Z1211 Encounter for screening for malignant neoplasm of colon: Secondary | ICD-10-CM

## 2020-05-22 DIAGNOSIS — E785 Hyperlipidemia, unspecified: Secondary | ICD-10-CM

## 2020-05-22 MED ORDER — PAROXETINE HCL 40 MG PO TABS: 40.0000 mg | ORAL_TABLET | ORAL | 3 refills | Status: DC

## 2020-05-22 MED ORDER — OMEPRAZOLE 20 MG PO CPDR: 20.0000 mg | DELAYED_RELEASE_CAPSULE | Freq: Every day | ORAL | 3 refills | Status: DC

## 2020-05-22 MED ORDER — HYDROCHLOROTHIAZIDE 25 MG PO TABS: 25.0000 mg | ORAL_TABLET | Freq: Every day | ORAL | 3 refills | Status: DC

## 2020-05-22 NOTE — Patient Instructions (Addendum)
Fat and Cholesterol Restricted Eating Plan Getting too much fat and cholesterol in your diet may cause health problems. Choosing the right foods helps keep your fat and cholesterol at normal levels. This can keep you from getting certain diseases. Your doctor may recommend an eating plan that includes:  Total fat: ______% or less of total calories a day.  Saturated fat: ______% or less of total calories a day.  Cholesterol: less than _________mg a day.  Fiber: ______g a day. What are tips for following this plan? Meal planning  At meals, divide your plate into four equal parts: ? Fill one-half of your plate with vegetables and green salads. ? Fill one-fourth of your plate with whole grains. ? Fill one-fourth of your plate with low-fat (lean) protein foods.  Eat fish that is high in omega-3 fats at least two times a week. This includes mackerel, tuna, sardines, and salmon.  Eat foods that are high in fiber, such as whole grains, beans, apples, broccoli, carrots, peas, and barley. General tips   Work with your doctor to lose weight if you need to.  Avoid: ? Foods with added sugar. ? Fried foods. ? Foods with partially hydrogenated oils.  Limit alcohol intake to no more than 1 drink a day for nonpregnant women and 2 drinks a day for men. One drink equals 12 oz of beer, 5 oz of wine, or 1 oz of hard liquor. Reading food labels  Check food labels for: ? Trans fats. ? Partially hydrogenated oils. ? Saturated fat (g) in each serving. ? Cholesterol (mg) in each serving. ? Fiber (g) in each serving.  Choose foods with healthy fats, such as: ? Monounsaturated fats. ? Polyunsaturated fats. ? Omega-3 fats.  Choose grain products that have whole grains. Look for the word "whole" as the first word in the ingredient list. Cooking  Cook foods using low-fat methods. These include baking, boiling, grilling, and broiling.  Eat more home-cooked foods. Eat at restaurants and buffets  less often.  Avoid cooking using saturated fats, such as butter, cream, palm oil, palm kernel oil, and coconut oil. Recommended foods  Fruits  All fresh, canned (in natural juice), or frozen fruits. Vegetables  Fresh or frozen vegetables (raw, steamed, roasted, or grilled). Green salads. Grains  Whole grains, such as whole wheat or whole grain breads, crackers, cereals, and pasta. Unsweetened oatmeal, bulgur, barley, quinoa, or brown rice. Corn or whole wheat flour tortillas. Meats and other protein foods  Ground beef (85% or leaner), grass-fed beef, or beef trimmed of fat. Skinless chicken or turkey. Ground chicken or turkey. Pork trimmed of fat. All fish and seafood. Egg whites. Dried beans, peas, or lentils. Unsalted nuts or seeds. Unsalted canned beans. Nut butters without added sugar or oil. Dairy  Low-fat or nonfat dairy products, such as skim or 1% milk, 2% or reduced-fat cheeses, low-fat and fat-free ricotta or cottage cheese, or plain low-fat and nonfat yogurt. Fats and oils  Tub margarine without trans fats. Light or reduced-fat mayonnaise and salad dressings. Avocado. Olive, canola, sesame, or safflower oils. The items listed above may not be a complete list of foods and beverages you can eat. Contact a dietitian for more information. Foods to avoid Fruits  Canned fruit in heavy syrup. Fruit in cream or butter sauce. Fried fruit. Vegetables  Vegetables cooked in cheese, cream, or butter sauce. Fried vegetables. Grains  White bread. White pasta. White rice. Cornbread. Bagels, pastries, and croissants. Crackers and snack foods that contain trans fat   and hydrogenated oils. Meats and other protein foods  Fatty cuts of meat. Ribs, chicken wings, bacon, sausage, bologna, salami, chitterlings, fatback, hot dogs, bratwurst, and packaged lunch meats. Liver and organ meats. Whole eggs and egg yolks. Chicken and turkey with skin. Fried meat. Dairy  Whole or 2% milk, cream,  half-and-half, and cream cheese. Whole milk cheeses. Whole-fat or sweetened yogurt. Full-fat cheeses. Nondairy creamers and whipped toppings. Processed cheese, cheese spreads, and cheese curds. Beverages  Alcohol. Sugar-sweetened drinks such as sodas, lemonade, and fruit drinks. Fats and oils  Butter, stick margarine, lard, shortening, ghee, or bacon fat. Coconut, palm kernel, and palm oils. Sweets and desserts  Corn syrup, sugars, honey, and molasses. Candy. Jam and jelly. Syrup. Sweetened cereals. Cookies, pies, cakes, donuts, muffins, and ice cream. The items listed above may not be a complete list of foods and beverages you should avoid. Contact a dietitian for more information. Summary  Choosing the right foods helps keep your fat and cholesterol at normal levels. This can keep you from getting certain diseases.  At meals, fill one-half of your plate with vegetables and green salads.  Eat high-fiber foods, like whole grains, beans, apples, carrots, peas, and barley.  Limit added sugar, saturated fats, alcohol, and fried foods. This information is not intended to replace advice given to you by your health care provider. Make sure you discuss any questions you have with your health care provider. Document Revised: 07/14/2018 Document Reviewed: 07/28/2017 Elsevier Patient Education  2020 Elsevier Inc.  

## 2020-05-22 NOTE — Progress Notes (Signed)
Subjective:    Patient ID: Kathryn Mitchell, female    DOB: October 28, 1971, 49 y.o.   MRN: [id number]  Chief Complaint  Patient presents with  . Medical Management of Chronic Issues   PT presents to the office today for chronic follow up. She is followed by GYN annually and her pap and mammogram is up to date. Her appt was 05/03/20 and had lab work completed.  She reports her husband is addicted to Meth and this is a huge stressor.  Hypertension This is a chronic problem. The current episode started more than 1 year ago. The problem has been resolved since onset. The problem is controlled. Associated symptoms include anxiety. Pertinent negatives include no malaise/fatigue, peripheral edema or shortness of breath. Risk factors for coronary artery disease include obesity and sedentary lifestyle. Past treatments include diuretics. The current treatment provides moderate improvement. There is no history of kidney disease, CAD/MI, CVA or heart failure.  Hyperlipidemia This is a chronic problem. The current episode started more than 1 year ago. The problem is uncontrolled. Recent lipid tests were reviewed and are high. Exacerbating diseases include hypothyroidism. Pertinent negatives include no shortness of breath. Current antihyperlipidemic treatment includes diet change. The current treatment provides no improvement of lipids. Risk factors for coronary artery disease include dyslipidemia, hypertension, female sex, a sedentary lifestyle and post-menopausal.  Anxiety Presents for follow-up visit. Symptoms include depressed mood, excessive worry, irritability, nervous/anxious behavior and panic. Patient reports no shortness of breath. Symptoms occur most days. The severity of symptoms is moderate.    Gastroesophageal Reflux She complains of belching and heartburn. This is a chronic problem. The problem occurs occasionally. The problem has been waxing and waning. She has tried a diet change and a PPI for the  symptoms. The treatment provided mild relief.      Review of Systems  Constitutional: Positive for irritability. Negative for malaise/fatigue.  Respiratory: Negative for shortness of breath.   Gastrointestinal: Positive for heartburn.  Psychiatric/Behavioral: The patient is nervous/anxious.   All other systems reviewed and are negative.      Objective:   Physical Exam Vitals reviewed.  Constitutional:      General: She is not in acute distress.    Appearance: She is well-developed.  HENT:     Head: Normocephalic and atraumatic.     Right Ear: Tympanic membrane normal.     Left Ear: Tympanic membrane normal.  Eyes:     Pupils: Pupils are equal, round, and reactive to light.  Neck:     Thyroid: No thyromegaly.  Cardiovascular:     Rate and Rhythm: Normal rate and regular rhythm.     Heart sounds: Normal heart sounds. No murmur heard.   Pulmonary:     Effort: Pulmonary effort is normal. No respiratory distress.     Breath sounds: Normal breath sounds. No wheezing.  Abdominal:     General: Bowel sounds are normal. There is no distension.     Palpations: Abdomen is soft.     Tenderness: There is no abdominal tenderness.  Musculoskeletal:        General: No tenderness. Normal range of motion.     Cervical back: Normal range of motion and neck supple.  Skin:    General: Skin is warm and dry.  Neurological:     Mental Status: She is alert and oriented to person, place, and time.     Cranial Nerves: No cranial nerve deficit.     Deep Tendon Reflexes: Reflexes are  normal and symmetric.  Psychiatric:        Behavior: Behavior normal.        Thought Content: Thought content normal.        Judgment: Judgment normal.       BP 108/69   Pulse (!) 59   Temp 98.2 F (36.8 C) (Temporal)   Ht 5\' 3"  (1.6 m)   Wt 193 lb 3.2 oz (87.6 kg)   BMI 34.22 kg/m      Assessment & Plan:  Kathryn Mitchell comes in today with chief complaint of Medical Management of Chronic  Issues   Diagnosis and orders addressed:  1. Essential hypertension, benign - hydrochlorothiazide (HYDRODIURIL) 25 MG tablet; Take 1 tablet (25 mg total) by mouth daily.  Dispense: 90 tablet; Refill: 3  2. GAD (generalized anxiety disorder) - PARoxetine (PAXIL) 40 MG tablet; Take 1 tablet (40 mg total) by mouth every morning. (Needs to be seen before next refill)  Dispense: 90 tablet; Refill: 3  3. Panic attacks - PARoxetine (PAXIL) 40 MG tablet; Take 1 tablet (40 mg total) by mouth every morning. (Needs to be seen before next refill)  Dispense: 90 tablet; Refill: 3  4. Gastroesophageal reflux disease, unspecified whether esophagitis present - omeprazole (PRILOSEC) 20 MG capsule; Take 1 capsule (20 mg total) by mouth daily.  Dispense: 90 capsule; Refill: 3  5. Hyperlipidemia, unspecified hyperlipidemia type  6. Obesity (BMI 30-39.9)   7. Colon cancer screening - Cologuard   Labs pending Health Maintenance reviewed Diet and exercise encouraged  Follow up plan: 6 months    Clayborne Dana, FNP

## 2020-10-23 ENCOUNTER — Ambulatory Visit (INDEPENDENT_AMBULATORY_CARE_PROVIDER_SITE_OTHER): Payer: 59 | Admitting: Family

## 2020-10-23 ENCOUNTER — Encounter: Payer: Self-pay | Admitting: Family

## 2020-10-23 ENCOUNTER — Telehealth: Payer: Self-pay

## 2020-10-23 DIAGNOSIS — U071 COVID-19: Secondary | ICD-10-CM

## 2020-10-23 DIAGNOSIS — F17200 Nicotine dependence, unspecified, uncomplicated: Secondary | ICD-10-CM

## 2020-10-23 MED ORDER — DEXAMETHASONE 6 MG PO TABS: 6.0000 mg | ORAL_TABLET | Freq: Two times a day (BID) | ORAL | 0 refills | Status: DC

## 2020-10-23 NOTE — Telephone Encounter (Signed)
  Incoming Patient Call  10/23/2020  What symptoms do you have? Was tested positive for COVID and needs something for cough. Needs appt for televisit 12-1  How long have you been sick? Since Sunday  Have you been seen for this problem? No  If your provider decides to give you a prescription, which pharmacy would you like for it to be sent to? [facility] in [place]   Patient informed that this information will be sent to the clinical staff for review and that they should receive a follow up call.

## 2020-10-23 NOTE — Telephone Encounter (Signed)
Nothing opened until Friday. Can you work patient in for a tele visit for pos COVID?

## 2020-10-23 NOTE — Telephone Encounter (Signed)
Patient aware and verbalized understanding. Appt made 

## 2020-10-23 NOTE — Telephone Encounter (Signed)
Ok to schedule today.

## 2020-10-23 NOTE — Progress Notes (Signed)
Virtual Visit via telephone Note Due to COVID-19 pandemic this visit was conducted virtually. This visit type was conducted due to national recommendations for restrictions regarding the COVID-19 Pandemic (e.g. social distancing, sheltering in place) in an effort to limit this patient's exposure and mitigate transmission in our community. All issues noted in this document were discussed and addressed.  A physical exam was not performed with this format.  I connected with Kathryn Mitchell on 10/23/20 at 3:38 pm  by telephone and verified that I am speaking with the correct person using two identifiers. Kathryn Mitchell is currently located at home and no one  is currently with her  during visit. The provider, Jannifer Rodney, FNP is located in their office at time of visit.  I discussed the limitations, risks, security and privacy concerns of performing an evaluation and management service by telephone and the availability of in person appointments. I also discussed with the patient that there may be a patient responsible charge related to this service. The patient expressed understanding and agreed to proceed.   History and Present Illness:  Pt calls the office today with positive COVID on 10/21/20 with her symptoms starting on 10/20/20. Her daughter was diagnosed with COVID last week.  Cough This is a new problem. The current episode started in the past 7 days. The problem has been waxing and waning. The problem occurs every few minutes. The cough is non-productive. Associated symptoms include chills, a fever, headaches, myalgias, nasal congestion and postnasal drip. Pertinent negatives include no rhinorrhea, sore throat, shortness of breath or wheezing. The symptoms are aggravated by lying down. Risk factors for lung disease include smoking/tobacco exposure. She has tried rest and OTC inhaler for the symptoms. The treatment provided mild relief.      Review of Systems  Constitutional: Positive  for chills and fever.  HENT: Positive for postnasal drip. Negative for rhinorrhea and sore throat.   Respiratory: Positive for cough. Negative for shortness of breath and wheezing.   Musculoskeletal: Positive for myalgias.  Neurological: Positive for headaches.     Observations/Objective: No SOB Or distress noted, nasally voice  Assessment and Plan: 1. COVID-19 virus detected Rest Force fluids Tylenol or Motrin as needed  If symptoms worsen call for MAB infusion  Long discussion with patient of red flags and symptoms management  - MyChart COVID-19 home monitoring program; Future - Temperature monitoring; Future - dexamethasone (DECADRON) 6 MG tablet; Take 1 tablet (6 mg total) by mouth 2 (two) times daily with a meal.  Dispense: 14 tablet; Refill: 0   2. Current smoker Smoking cessation      I discussed the assessment and treatment plan with the patient. The patient was provided an opportunity to ask questions and all were answered. The patient agreed with the plan and demonstrated an understanding of the instructions.   The patient was advised to call back or seek an in-person evaluation if the symptoms worsen or if the condition fails to improve as anticipated.  The above assessment and management plan was discussed with the patient. The patient verbalized understanding of and has agreed to the management plan. Patient is aware to call the clinic if symptoms persist or worsen. Patient is aware when to return to the clinic for a follow-up visit. Patient educated on when it is appropriate to go to the emergency department.   Time call ended:  3:51 pm   I provided 13 minutes of non-face-to-face time during this encounter.  Evelina Dun, FNP

## 2020-10-24 ENCOUNTER — Ambulatory Visit: Payer: 59 | Admitting: Family

## 2021-04-28 ENCOUNTER — Other Ambulatory Visit: Payer: Self-pay | Admitting: Family

## 2021-04-28 DIAGNOSIS — F411 Generalized anxiety disorder: Secondary | ICD-10-CM

## 2021-04-28 DIAGNOSIS — F410 Panic disorder [episodic paroxysmal anxiety] without agoraphobia: Secondary | ICD-10-CM

## 2021-06-06 ENCOUNTER — Ambulatory Visit (INDEPENDENT_AMBULATORY_CARE_PROVIDER_SITE_OTHER): Payer: 59 | Admitting: Family Medicine

## 2021-06-06 DIAGNOSIS — J0100 Acute maxillary sinusitis, unspecified: Secondary | ICD-10-CM | POA: Diagnosis not present

## 2021-06-06 MED ORDER — AMOXICILLIN 500 MG PO CAPS: 500.0000 mg | ORAL_CAPSULE | Freq: Two times a day (BID) | ORAL | 0 refills | Status: DC

## 2021-06-06 MED ORDER — FLUTICASONE PROPIONATE 50 MCG/ACT NA SUSP: 1.0000 | Freq: Two times a day (BID) | NASAL | 6 refills | Status: DC | PRN

## 2021-06-06 NOTE — Progress Notes (Signed)
Virtual Visit via telephone Note  I connected with Kathryn Mitchell on 06/06/21 at 1700 by telephone and verified that I am speaking with the correct person using two identifiers. Kathryn Mitchell is currently located at home and patient are currently with her during visit. The provider, Elige Radon Scottie Stanish, MD is located in their office at time of visit.  Call ended at 1707  I discussed the limitations, risks, security and privacy concerns of performing an evaluation and management service by telephone and the availability of in person appointments. I also discussed with the patient that there may be a patient responsible charge related to this service. The patient expressed understanding and agreed to proceed.   History and Present Illness: Patient is calling in for headache and congestion.  The headache started 5 days ago.  She awoke today with 100.4 and fever and achiness. She slept all day today.  She has a lot of congestion and burning and pressure in her head. She did a home covid test today. She denies shortness of breath or wheezing.  She took advil and slept and it helped.   1. Acute maxillary sinusitis, recurrence not specified     Outpatient Encounter Medications as of 06/06/2021  Medication Sig   amoxicillin (AMOXIL) 500 MG capsule Take 1 capsule (500 mg total) by mouth 2 (two) times daily.   fluticasone (FLONASE) 50 MCG/ACT nasal spray Place 1 spray into both nostrils 2 (two) times daily as needed for allergies or rhinitis.   busPIRone (BUSPAR) 5 MG tablet TAKE 1 TABLET (5 MG TOTAL) BY MOUTH 3 (THREE) TIMES DAILY AS NEEDED. (Patient not taking: Reported on 05/22/2020)   dexamethasone (DECADRON) 6 MG tablet Take 1 tablet (6 mg total) by mouth 2 (two) times daily with a meal.   hydrochlorothiazide (HYDRODIURIL) 25 MG tablet Take 1 tablet (25 mg total) by mouth daily.   omeprazole (PRILOSEC) 20 MG capsule Take 1 capsule (20 mg total) by mouth daily.   PARoxetine (PAXIL) 40 MG tablet  Take 1 tablet (40 mg total) by mouth in the morning. (NEEDS TO BE SEEN BEFORE NEXT REFILL)   valACYclovir (VALTREX) 500 MG tablet Take 4 tablets (2,000 mg total) by mouth 2 (two) times daily. (Patient not taking: Reported on 05/22/2020)   No facility-administered encounter medications on file as of 06/06/2021.    Review of Systems  Constitutional:  Positive for chills and fever.  HENT:  Positive for postnasal drip, rhinorrhea, sinus pressure and sneezing. Negative for congestion, ear discharge, ear pain and sore throat.   Eyes:  Negative for pain, redness and visual disturbance.  Respiratory:  Negative for chest tightness and shortness of breath.   Cardiovascular:  Negative for chest pain and leg swelling.  Genitourinary:  Negative for difficulty urinating and dysuria.  Musculoskeletal:  Positive for myalgias. Negative for back pain and gait problem.  Skin:  Negative for rash.  Neurological:  Negative for light-headedness and headaches.  Psychiatric/Behavioral:  Negative for agitation and behavioral problems.   All other systems reviewed and are negative.  Observations/Objective: Patient sounds comfortable and in no acute distress.   Assessment and Plan: Problem List Items Addressed This Visit   None Visit Diagnoses     Acute maxillary sinusitis, recurrence not specified    -  Primary   Relevant Medications   amoxicillin (AMOXIL) 500 MG capsule   fluticasone (FLONASE) 50 MCG/ACT nasal spray   Other Relevant Orders   Novel Coronavirus, NAA ([facility])  Patient coming in for COVID testing tomorrow, we will do amoxicillin and Flonase and treat like sinus infection.  If anything worsens or does not improve she can call back Follow up plan: Return if symptoms worsen or fail to improve.     I discussed the assessment and treatment plan with the patient. The patient was provided an opportunity to ask questions and all were answered. The patient agreed with the plan and  demonstrated an understanding of the instructions.   The patient was advised to call back or seek an in-person evaluation if the symptoms worsen or if the condition fails to improve as anticipated.  The above assessment and management plan was discussed with the patient. The patient verbalized understanding of and has agreed to the management plan. Patient is aware to call the clinic if symptoms persist or worsen. Patient is aware when to return to the clinic for a follow-up visit. Patient educated on when it is appropriate to go to the emergency department.    I provided 7 minutes of non-face-to-face time during this encounter.    Nils Pyle, MD

## 2021-06-07 ENCOUNTER — Other Ambulatory Visit: Payer: Self-pay

## 2021-06-07 DIAGNOSIS — J0100 Acute maxillary sinusitis, unspecified: Secondary | ICD-10-CM

## 2021-06-08 LAB — NOVEL CORONAVIRUS, NAA: SARS-CoV-2, NAA: NOT DETECTED

## 2021-06-08 LAB — SARS-COV-2, NAA 2 DAY TAT

## 2021-07-26 ENCOUNTER — Other Ambulatory Visit: Payer: Self-pay | Admitting: Family

## 2021-07-26 DIAGNOSIS — K219 Gastro-esophageal reflux disease without esophagitis: Secondary | ICD-10-CM

## 2021-07-27 ENCOUNTER — Other Ambulatory Visit: Payer: Self-pay | Admitting: Family

## 2021-07-27 DIAGNOSIS — I10 Essential (primary) hypertension: Secondary | ICD-10-CM

## 2021-09-09 ENCOUNTER — Other Ambulatory Visit: Payer: Self-pay | Admitting: Family

## 2021-09-09 DIAGNOSIS — K219 Gastro-esophageal reflux disease without esophagitis: Secondary | ICD-10-CM

## 2021-09-10 ENCOUNTER — Other Ambulatory Visit: Payer: Self-pay | Admitting: Family

## 2021-09-10 DIAGNOSIS — K219 Gastro-esophageal reflux disease without esophagitis: Secondary | ICD-10-CM

## 2021-09-10 MED ORDER — OMEPRAZOLE 20 MG PO CPDR: 20.0000 mg | DELAYED_RELEASE_CAPSULE | Freq: Every day | ORAL | 1 refills | Status: DC

## 2021-09-10 NOTE — Telephone Encounter (Signed)
  Prescription Request  09/10/2021  Is this a "Controlled Substance" medicine? NO Have you seen your PCP in the last 2 weeks? NO If YES, route message to pool  -  If NO, patient needs to be scheduled for appointment.  What is the name of the medication or equipment? Omeprazole 20 mg Patient has appt 11-1 and needs medication  Have you contacted your pharmacy to request a refill? YES  Which pharmacy would you like this sent to? [facility] in [place] on [address]   Patient notified that their request is being sent to the clinical staff for review and that they should receive a response within 2 business days.

## 2021-09-11 NOTE — Telephone Encounter (Signed)
LMOVM refill sent to pharmacy 

## 2021-09-24 ENCOUNTER — Ambulatory Visit: Payer: 59 | Admitting: Family

## 2021-09-27 ENCOUNTER — Ambulatory Visit: Payer: 59 | Admitting: Family

## 2021-10-03 ENCOUNTER — Other Ambulatory Visit: Payer: Self-pay

## 2021-10-03 ENCOUNTER — Encounter: Payer: Self-pay | Admitting: Family

## 2021-10-03 ENCOUNTER — Ambulatory Visit: Payer: 59 | Admitting: Family

## 2021-10-03 VITALS — BP 119/80 | HR 79 | Temp 97.4°F | Ht 63.0 in | Wt 194.2 lb

## 2021-10-03 DIAGNOSIS — Z1159 Encounter for screening for other viral diseases: Secondary | ICD-10-CM

## 2021-10-03 DIAGNOSIS — Z0000 Encounter for general adult medical examination without abnormal findings: Secondary | ICD-10-CM

## 2021-10-03 DIAGNOSIS — Z23 Encounter for immunization: Secondary | ICD-10-CM | POA: Diagnosis not present

## 2021-10-03 DIAGNOSIS — Z0001 Encounter for general adult medical examination with abnormal findings: Secondary | ICD-10-CM | POA: Diagnosis not present

## 2021-10-03 DIAGNOSIS — I10 Essential (primary) hypertension: Secondary | ICD-10-CM | POA: Diagnosis not present

## 2021-10-03 DIAGNOSIS — E669 Obesity, unspecified: Secondary | ICD-10-CM

## 2021-10-03 DIAGNOSIS — F411 Generalized anxiety disorder: Secondary | ICD-10-CM

## 2021-10-03 DIAGNOSIS — F410 Panic disorder [episodic paroxysmal anxiety] without agoraphobia: Secondary | ICD-10-CM | POA: Diagnosis not present

## 2021-10-03 DIAGNOSIS — E785 Hyperlipidemia, unspecified: Secondary | ICD-10-CM

## 2021-10-03 DIAGNOSIS — K219 Gastro-esophageal reflux disease without esophagitis: Secondary | ICD-10-CM

## 2021-10-03 DIAGNOSIS — J0100 Acute maxillary sinusitis, unspecified: Secondary | ICD-10-CM | POA: Diagnosis not present

## 2021-10-03 DIAGNOSIS — Z1211 Encounter for screening for malignant neoplasm of colon: Secondary | ICD-10-CM

## 2021-10-03 DIAGNOSIS — R5383 Other fatigue: Secondary | ICD-10-CM

## 2021-10-03 DIAGNOSIS — B372 Candidiasis of skin and nail: Secondary | ICD-10-CM

## 2021-10-03 MED ORDER — PAROXETINE HCL 40 MG PO TABS: 40.0000 mg | ORAL_TABLET | Freq: Every morning | ORAL | 3 refills | Status: DC

## 2021-10-03 MED ORDER — OMEPRAZOLE 20 MG PO CPDR: 20.0000 mg | DELAYED_RELEASE_CAPSULE | Freq: Every day | ORAL | 1 refills | Status: DC

## 2021-10-03 MED ORDER — BUSPIRONE HCL 5 MG PO TABS: 5.0000 mg | ORAL_TABLET | Freq: Three times a day (TID) | ORAL | 0 refills | Status: DC | PRN

## 2021-10-03 MED ORDER — FLUCONAZOLE 150 MG PO TABS: 150.0000 mg | ORAL_TABLET | ORAL | 0 refills | Status: DC | PRN

## 2021-10-03 MED ORDER — NYSTATIN 100000 UNIT/GM EX POWD: 1.0000 "application " | Freq: Three times a day (TID) | CUTANEOUS | 2 refills | Status: DC

## 2021-10-03 MED ORDER — FLUTICASONE PROPIONATE 50 MCG/ACT NA SUSP: 1.0000 | Freq: Two times a day (BID) | NASAL | 6 refills | Status: DC | PRN

## 2021-10-03 MED ORDER — KETOCONAZOLE 2 % EX CREA: 1.0000 "application " | TOPICAL_CREAM | Freq: Every day | CUTANEOUS | 0 refills | Status: DC

## 2021-10-03 MED ORDER — HYDROCHLOROTHIAZIDE 25 MG PO TABS: 25.0000 mg | ORAL_TABLET | Freq: Every day | ORAL | 3 refills | Status: DC

## 2021-10-03 NOTE — Progress Notes (Signed)
Subjective:    Patient ID: Kathryn Mitchell, female    DOB: 24-May-1971, 50 y.o.   MRN: [id number]  Chief Complaint  Patient presents with   Medical Management of Chronic Issues   PT presents to the office today for CPE and  chronic follow up. She is followed by GYN annually and her pap and mammogram is up to date. Her appt was 05/03/20 and had lab work completed.  She reports her husband is addicted to Meth and this is a huge stressor.   She reports ongoing fatigue over the last year that has worsen.  Hypertension This is a chronic problem. The current episode started more than 1 year ago. The problem has been resolved since onset. The problem is controlled. Associated symptoms include anxiety. Pertinent negatives include no malaise/fatigue, peripheral edema or shortness of breath. Risk factors for coronary artery disease include dyslipidemia and obesity. The current treatment provides moderate improvement.  Gastroesophageal Reflux She complains of belching and heartburn. This is a chronic problem. The current episode started more than 1 year ago. The problem occurs occasionally. Risk factors include obesity. She has tried a PPI for the symptoms. The treatment provided moderate relief.  Hyperlipidemia This is a chronic problem. The current episode started more than 1 year ago. Exacerbating diseases include obesity. Pertinent negatives include no shortness of breath. She is currently on no antihyperlipidemic treatment. The current treatment provides moderate improvement of lipids. Risk factors for coronary artery disease include dyslipidemia, hypertension, a sedentary lifestyle and post-menopausal.  Anxiety Presents for follow-up visit. Symptoms include depressed mood, excessive worry, irritability, nervous/anxious behavior and restlessness. Patient reports no shortness of breath. Symptoms occur most days. The severity of symptoms is moderate.    Rash This is a new problem. The current episode  started more than 1 month ago. The problem has been waxing and waning since onset. Location: gluteal fold. Pertinent negatives include no shortness of breath.     Review of Systems  Constitutional:  Positive for irritability. Negative for malaise/fatigue.  Respiratory:  Negative for shortness of breath.   Gastrointestinal:  Positive for heartburn.  Skin:  Positive for rash.  Psychiatric/Behavioral:  The patient is nervous/anxious.   All other systems reviewed and are negative.     Objective:   Physical Exam Vitals reviewed.  Constitutional:      General: She is not in acute distress.    Appearance: She is well-developed. She is obese.  HENT:     Head: Normocephalic and atraumatic.     Right Ear: Tympanic membrane normal.     Left Ear: Tympanic membrane normal.  Eyes:     Pupils: Pupils are equal, round, and reactive to light.  Neck:     Thyroid: No thyromegaly.  Cardiovascular:     Rate and Rhythm: Normal rate and regular rhythm.     Heart sounds: Normal heart sounds. No murmur heard. Pulmonary:     Effort: Pulmonary effort is normal. No respiratory distress.     Breath sounds: Normal breath sounds. No wheezing.  Abdominal:     General: Bowel sounds are normal. There is no distension.     Palpations: Abdomen is soft.     Tenderness: There is no abdominal tenderness.  Musculoskeletal:        General: No tenderness. Normal range of motion.     Cervical back: Normal range of motion and neck supple.  Skin:    General: Skin is warm and dry.  Comments: Erythemas and cracked rash in gluteal fold  Neurological:     Mental Status: She is alert and oriented to person, place, and time.     Cranial Nerves: No cranial nerve deficit.     Deep Tendon Reflexes: Reflexes are normal and symmetric.  Psychiatric:        Behavior: Behavior normal.        Thought Content: Thought content normal.        Judgment: Judgment normal.      BP 119/80   Pulse 79   Temp (!) 97.4  F (36.3 C) (Temporal)   Ht _0  (1.6 m)   Wt 194 lb 3.2 oz (88.1 kg)   LMP 01/01/2017   BMI 34.40 kg/m      Assessment & Plan:  Kathryn Mitchell comes in today with chief complaint of Medical Management of Chronic Issues   Diagnosis and orders addressed:  1. Acute maxillary sinusitis, recurrence not specified - CMP14+EGFR  2. Essential hypertension, benign - hydrochlorothiazide (HYDRODIURIL) 25 MG tablet; Take 1 tablet (25 mg total) by mouth daily. (NEEDS TO BE SEEN BEFORE NEXT REFILL)  Dispense: 90 tablet; Refill: 3 - CMP14+EGFR  3. GAD (generalized anxiety disorder) - PARoxetine (PAXIL) 40 MG tablet; Take 1 tablet (40 mg total) by mouth in the morning. (NEEDS TO BE SEEN BEFORE NEXT REFILL)  Dispense: 90 tablet; Refill: 3 - CMP14+EGFR - busPIRone (BUSPAR) 5 MG tablet; Take 1 tablet (5 mg total) by mouth 3 (three) times daily as needed.  Dispense: 270 tablet; Refill: 0  4. Panic attacks - PARoxetine (PAXIL) 40 MG tablet; Take 1 tablet (40 mg total) by mouth in the morning. (NEEDS TO BE SEEN BEFORE NEXT REFILL)  Dispense: 90 tablet; Refill: 3 - CMP14+EGFR - busPIRone (BUSPAR) 5 MG tablet; Take 1 tablet (5 mg total) by mouth 3 (three) times daily as needed.  Dispense: 270 tablet; Refill: 0  5. Gastroesophageal reflux disease, unspecified whether esophagitis present - omeprazole (PRILOSEC) 20 MG capsule; Take 1 capsule (20 mg total) by mouth daily.  Dispense: 90 capsule; Refill: 1 - CMP14+EGFR  6. Need for immunization against influenza - Flu Vaccine QUAD 50moIM (Fluarix, Fluzone & Alfiuria Quad PF) - CMP14+EGFR  7. Annual physical exam - Hepatitis C antibody - CMP14+EGFR - Lipid panel - TSH - Anemia Profile B - FSH/LH  8. Obesity (BMI 30-39.9) - CMP14+EGFR  9. Hyperlipidemia, unspecified hyperlipidemia type - CMP14+EGFR  10. Other fatigue Labs pending  Encourage daily exercise  - CMP14+EGFR - TSH - Anemia Profile B - FSH/LH  11. Colon cancer screening -  Ambulatory referral to Gastroenterology - CMP14+EGFR  12. Need for hepatitis C screening test - Hepatitis C antibody - CMP14+EGFR  13. Candidal skin infection Keep clean and dry Do not scratch - fluconazole (DIFLUCAN) 150 MG tablet; Take 1 tablet (150 mg total) by mouth every three (3) days as needed.  Dispense: 3 tablet; Refill: 0 - nystatin (MYCOSTATIN/NYSTOP) powder; Apply 1 application topically 3 (three) times daily.  Dispense: 60 g; Refill: 2 - ketoconazole (NIZORAL) 2 % cream; Apply 1 application topically daily.  Dispense: 15 g; Refill: 0   Labs pending Health Maintenance reviewed Diet and exercise encouraged  Follow up plan: 6 months    CEvelina Dun FNP

## 2021-10-03 NOTE — Patient Instructions (Signed)

## 2021-10-04 LAB — HEPATITIS C ANTIBODY: Hep C Virus Ab: <0.1 s/co ratio (ref 0.0–0.9)

## 2021-10-04 LAB — ANEMIA PROFILE B
Basophils Absolute: 0 10*3/uL (ref 0.0–0.2)
Basos: 0 %
EOS (ABSOLUTE): 0.1 10*3/uL (ref 0.0–0.4)
Eos: 1 %
Ferritin: 63 ng/mL (ref 15–150)
Folate: 4.7 ng/mL (ref >3.0)
Hematocrit: 46 % (ref 34.0–46.6)
Hemoglobin: 15.2 g/dL (ref 11.1–15.9)
Immature Grans (Abs): 0 10*3/uL (ref 0.0–0.1)
Immature Granulocytes: 0 %
Iron Saturation: 22 % (ref 15–55)
Iron: 76 ug/dL (ref 27–159)
Lymphocytes Absolute: 2.1 10*3/uL (ref 0.7–3.1)
Lymphs: 28 %
MCH: 29.4 pg (ref 26.6–33.0)
MCHC: 33 g/dL (ref 31.5–35.7)
MCV: 89 fL (ref 79–97)
Monocytes Absolute: 0.3 10*3/uL (ref 0.1–0.9)
Monocytes: 4 %
Neutrophils Absolute: 4.8 10*3/uL (ref 1.4–7.0)
Neutrophils: 67 %
Platelets: 257 10*3/uL (ref 150–450)
RBC: 5.17 x10E6/uL (ref 3.77–5.28)
RDW: 12.4 % (ref 11.7–15.4)
Retic Ct Pct: 1.3 % (ref 0.6–2.6)
Total Iron Binding Capacity: 341 ug/dL (ref 250–450)
UIBC: 265 ug/dL (ref 131–425)
Vitamin B-12: 312 pg/mL (ref 232–1245)
WBC: 7.3 10*3/uL (ref 3.4–10.8)

## 2021-10-04 LAB — CMP14+EGFR
ALT: 13 IU/L (ref 0–32)
AST: 13 IU/L (ref 0–40)
Albumin/Globulin Ratio: 1.3 (ref 1.2–2.2)
Albumin: 3.8 g/dL (ref 3.8–4.9)
Alkaline Phosphatase: 111 IU/L (ref 44–121)
BUN/Creatinine Ratio: 12 (ref 9–23)
BUN: 9 mg/dL (ref 6–24)
Bilirubin Total: 0.3 mg/dL (ref 0.0–1.2)
CO2: 27 mmol/L (ref 20–29)
Calcium: 9.2 mg/dL (ref 8.7–10.2)
Chloride: 100 mmol/L (ref 96–106)
Creatinine, Ser: 0.75 mg/dL (ref 0.57–1.00)
Globulin, Total: 2.9 g/dL (ref 1.5–4.5)
Glucose: 89 mg/dL (ref 70–99)
Potassium: 3.9 mmol/L (ref 3.5–5.2)
Sodium: 142 mmol/L (ref 134–144)
Total Protein: 6.7 g/dL (ref 6.0–8.5)
eGFR: 96 mL/min/{1.73_m2} (ref >59)

## 2021-10-04 LAB — TSH: TSH: 1.63 u[IU]/mL (ref 0.450–4.500)

## 2021-10-04 LAB — FSH/LH
FSH: 52.8 m[IU]/mL
LH: 34.2 m[IU]/mL

## 2021-10-04 LAB — LIPID PANEL
Chol/HDL Ratio: 5.1 ratio — ABNORMAL HIGH (ref 0.0–4.4)
Cholesterol, Total: 268 mg/dL — ABNORMAL HIGH (ref 100–199)
HDL: 53 mg/dL (ref >39)
LDL Chol Calc (NIH): 175 mg/dL — ABNORMAL HIGH (ref 0–99)
Triglycerides: 213 mg/dL — ABNORMAL HIGH (ref 0–149)
VLDL Cholesterol Cal: 40 mg/dL (ref 5–40)

## 2021-10-07 ENCOUNTER — Encounter: Payer: Self-pay | Admitting: Internal Medicine

## 2022-01-06 ENCOUNTER — Other Ambulatory Visit: Payer: Self-pay

## 2022-01-06 ENCOUNTER — Ambulatory Visit
Admission: EM | Admit: 2022-01-06 | Discharge: 2022-01-06 | Disposition: A | Discharge status: Home / Self Care | Payer: No Typology Code available for payment source | Attending: Urgent Care | Admitting: Urgent Care

## 2022-01-06 DIAGNOSIS — N61 Mastitis without abscess: Secondary | ICD-10-CM

## 2022-01-06 MED ORDER — KETOROLAC TROMETHAMINE 60 MG/2ML IM SOLN
60.0000 mg | Freq: Once | INTRAMUSCULAR | Status: AC
  Administered 2022-01-06: 60 mg via INTRAMUSCULAR

## 2022-01-06 MED ORDER — NAPROXEN 500 MG PO TABS: 500.0000 mg | ORAL_TABLET | Freq: Two times a day (BID) | ORAL | 0 refills | Status: DC

## 2022-01-06 MED ORDER — DOXYCYCLINE HYCLATE 100 MG PO CAPS
100.0000 mg | ORAL_CAPSULE | Freq: Two times a day (BID) | ORAL | 0 refills | Status: DC
Start: 2022-01-06 — End: 2022-05-20

## 2022-01-06 NOTE — ED Triage Notes (Signed)
Pt presents with abscess to right breast for past couple days

## 2022-01-06 NOTE — ED Provider Notes (Signed)
[facility]   MRN: [id number] DOB: [birth year]  Subjective:   Kathryn Mitchell is a 51 y.o. female presenting for 2-3 day history of acute onset right breast pain and inflammation.  Symptoms started out with what looked like a pimple.  Patient popped it and ever since then has continued to progress and worsen.  Reports that she feels heat from the area, has exquisite tenderness and that the tissue is very hard.   Current Facility-Administered Medications:    ketorolac (TORADOL) injection 60 mg, 60 mg, Intramuscular, Once, Wallis Bamberg, PA-C  Current Outpatient Medications:    doxycycline (VIBRAMYCIN) 100 MG capsule, Take 1 capsule (100 mg total) by mouth 2 (two) times daily., Disp: 20 capsule, Rfl: 0   naproxen (NAPROSYN) 500 MG tablet, Take 1 tablet (500 mg total) by mouth 2 (two) times daily with a meal., Disp: 30 tablet, Rfl: 0   busPIRone (BUSPAR) 5 MG tablet, Take 1 tablet (5 mg total) by mouth 3 (three) times daily as needed., Disp: 270 tablet, Rfl: 0   fluconazole (DIFLUCAN) 150 MG tablet, Take 1 tablet (150 mg total) by mouth every three (3) days as needed., Disp: 3 tablet, Rfl: 0   fluticasone (FLONASE) 50 MCG/ACT nasal spray, Place 1 spray into both nostrils 2 (two) times daily as needed for allergies or rhinitis., Disp: 16 g, Rfl: 6   hydrochlorothiazide (HYDRODIURIL) 25 MG tablet, Take 1 tablet (25 mg total) by mouth daily. (NEEDS TO BE SEEN BEFORE NEXT REFILL), Disp: 90 tablet, Rfl: 3   ketoconazole (NIZORAL) 2 % cream, Apply 1 application topically daily., Disp: 15 g, Rfl: 0   nystatin (MYCOSTATIN/NYSTOP) powder, Apply 1 application topically 3 (three) times daily., Disp: 60 g, Rfl: 2   omeprazole (PRILOSEC) 20 MG capsule, Take 1 capsule (20 mg total) by mouth daily., Disp: 90 capsule, Rfl: 1   PARoxetine (PAXIL) 40 MG tablet, Take 1 tablet (40 mg total) by mouth in the morning. (NEEDS TO BE SEEN BEFORE NEXT REFILL), Disp: 90 tablet, Rfl: 3   valACYclovir  (VALTREX) 500 MG tablet, Take 4 tablets (2,000 mg total) by mouth 2 (two) times daily., Disp: 4 tablet, Rfl: 1   Allergies  Allergen Reactions   Codeine     Past Medical History:  Diagnosis Date   Hypertension      Past Surgical History:  Procedure Laterality Date   Cyst removal lower back     TUBAL LIGATION      Family History  Problem Relation Age of Onset   Cancer Mother    Liver disease Father     Social History   Tobacco Use   Smoking status: Former   Smokeless tobacco: Never  Building services engineer Use: Never used    ROS   Objective:   Vitals: BP 120/78    Pulse 79    Temp 98.3 F (36.8 C)    Resp 18    LMP 01/01/2017    SpO2 95%   Physical Exam Exam conducted with a chaperone present (RN Amy).  Constitutional:      General: She is not in acute distress.    Appearance: Normal appearance. She is well-developed. She is not ill-appearing, toxic-appearing or diaphoretic.  HENT:     Head: Normocephalic and atraumatic.     Nose: Nose normal.     Mouth/Throat:     Mouth: Mucous membranes are moist.  Eyes:     General: No scleral icterus.  Right eye: No discharge.        Left eye: No discharge.     Extraocular Movements: Extraocular movements intact.  Cardiovascular:     Rate and Rhythm: Normal rate.  Pulmonary:     Effort: Pulmonary effort is normal.  Chest:  Breasts:    Right: Tenderness present. No swelling, bleeding, inverted nipple, mass, nipple discharge or skin change.     Left: No swelling, bleeding, inverted nipple, mass, nipple discharge, skin change or tenderness.    Skin:    General: Skin is warm and dry.  Neurological:     General: No focal deficit present.     Mental Status: She is alert and oriented to person, place, and time.  Psychiatric:        Mood and Affect: Mood normal.        Behavior: Behavior normal.    IM Toradol in clinic.  Assessment and Plan :   PDMP not reviewed this encounter.  1. Cellulitis of right  breast   2. Mastitis    Area not amenable to incision and drainage. Recommended starting doxycycline, use warm compresses.  Naproxen for pain and inflammation. Counseled patient on potential for adverse effects with medications prescribed/recommended today, ER and return-to-clinic precautions discussed, patient verbalized understanding.    Wallis Bamberg, [place] 01/06/22 1448

## 2022-02-15 ENCOUNTER — Encounter: Payer: Self-pay | Admitting: Gastroenterology

## 2022-02-15 NOTE — Progress Notes (Deleted)
? ?Referring Provider: Junie Spencer, FNP ?Primary Care Physician:  Junie Spencer, FNP ?Primary Gastroenterologist:  Dr. Jena Gauss ? ?No chief complaint on file. ? ? ?HPI:   ?Kathryn Mitchell is a 51 y.o. female presenting today at the request of Junie Spencer, FNP for colon cancer screening. ? ? ?Today: ? ? ? ? ?Reviewed most recent labs in our system completed November 2022.  No evidence of anemia.  LFTs within normal limits.  Hep C antibody negative. ? ? ?Past Medical History:  ?Diagnosis Date  ? Hypertension   ? ? ?Past Surgical History:  ?Procedure Laterality Date  ? Cyst removal lower back    ? TUBAL LIGATION    ? ? ?Current Outpatient Medications  ?Medication Sig Dispense Refill  ? busPIRone (BUSPAR) 5 MG tablet Take 1 tablet (5 mg total) by mouth 3 (three) times daily as needed. 270 tablet 0  ? doxycycline (VIBRAMYCIN) 100 MG capsule Take 1 capsule (100 mg total) by mouth 2 (two) times daily. 20 capsule 0  ? fluconazole (DIFLUCAN) 150 MG tablet Take 1 tablet (150 mg total) by mouth every three (3) days as needed. 3 tablet 0  ? fluticasone (FLONASE) 50 MCG/ACT nasal spray Place 1 spray into both nostrils 2 (two) times daily as needed for allergies or rhinitis. 16 g 6  ? hydrochlorothiazide (HYDRODIURIL) 25 MG tablet Take 1 tablet (25 mg total) by mouth daily. (NEEDS TO BE SEEN BEFORE NEXT REFILL) 90 tablet 3  ? ketoconazole (NIZORAL) 2 % cream Apply 1 application topically daily. 15 g 0  ? naproxen (NAPROSYN) 500 MG tablet Take 1 tablet (500 mg total) by mouth 2 (two) times daily with a meal. 30 tablet 0  ? nystatin (MYCOSTATIN/NYSTOP) powder Apply 1 application topically 3 (three) times daily. 60 g 2  ? omeprazole (PRILOSEC) 20 MG capsule Take 1 capsule (20 mg total) by mouth daily. 90 capsule 1  ? PARoxetine (PAXIL) 40 MG tablet Take 1 tablet (40 mg total) by mouth in the morning. (NEEDS TO BE SEEN BEFORE NEXT REFILL) 90 tablet 3  ? valACYclovir (VALTREX) 500 MG tablet Take 4 tablets (2,000 mg total)  by mouth 2 (two) times daily. 4 tablet 1  ? ?No current facility-administered medications for this visit.  ? ? ?Allergies as of 02/17/2022 - Review Complete 01/06/2022  ?Allergen Reaction Noted  ? Codeine  12/14/2013  ? ? ?Family History  ?Problem Relation Age of Onset  ? Cancer Mother   ? Liver disease Father   ? ? ?Social History  ? ?Socioeconomic History  ? Marital status: Married  ?  Spouse name: Not on file  ? Number of children: Not on file  ? Years of education: Not on file  ? Highest education level: Not on file  ?Occupational History  ? Not on file  ?Tobacco Use  ? Smoking status: Former  ? Smokeless tobacco: Never  ?Vaping Use  ? Vaping Use: Never used  ?Substance and Sexual Activity  ? Alcohol use: Not on file  ? Drug use: Not on file  ? Sexual activity: Not on file  ?Other Topics Concern  ? Not on file  ?Social History Narrative  ? Not on file  ? ?Social Determinants of Health  ? ?Financial Resource Strain: Not on file  ?Food Insecurity: Not on file  ?Transportation Needs: Not on file  ?Physical Activity: Not on file  ?Stress: Not on file  ?Social Connections: Not on file  ?Intimate Partner Violence: Not  on file  ? ? ?Review of Systems: ?Gen: Denies any fever, chills, fatigue, weight loss, lack of appetite.  ?CV: Denies chest pain, heart palpitations, peripheral edema, syncope.  ?Resp: Denies shortness of breath at rest or with exertion. Denies wheezing or cough.  ?GI: Denies dysphagia or odynophagia. Denies jaundice, hematemesis, fecal incontinence. ?GU : Denies urinary burning, urinary frequency, urinary hesitancy ?MS: Denies joint pain, muscle weakness, cramps, or limitation of movement.  ?Derm: Denies rash, itching, dry skin ?Psych: Denies depression, anxiety, memory loss, and confusion ?Heme: Denies bruising, bleeding, and enlarged lymph nodes. ? ?Physical Exam: ?LMP 01/01/2017  ?General:   Alert and oriented. Pleasant and cooperative. Well-nourished and well-developed.  ?Head:  Normocephalic and  atraumatic. ?Eyes:  Without icterus, sclera clear and conjunctiva pink.  ?Ears:  Normal auditory acuity. ?Lungs:  Clear to auscultation bilaterally. No wheezes, rales, or rhonchi. No distress.  ?Heart:  S1, S2 present without murmurs appreciated.  ?Abdomen:  +BS, soft, non-tender and non-distended. No HSM noted. No guarding or rebound. No masses appreciated.  ?Rectal:  Deferred  ?Msk:  Symmetrical without gross deformities. Normal posture. ?Extremities:  Without edema. ?Neurologic:  Alert and  oriented x4;  grossly normal neurologically. ?Skin:  Intact without significant lesions or rashes. ?Psych:  Alert and cooperative. Normal mood and affect. ? ? ? ?Assessment:  ? ? ? ?Plan:  ?*** ? ? ?Ermalinda Memos, PA-C ?[facility] ?02/17/2022 ? ? ?

## 2022-02-17 ENCOUNTER — Ambulatory Visit: Payer: BLUE CROSS/BLUE SHIELD | Admitting: Gastroenterology

## 2022-02-17 ENCOUNTER — Encounter: Payer: Self-pay | Admitting: Gastroenterology

## 2022-05-20 ENCOUNTER — Ambulatory Visit (INDEPENDENT_AMBULATORY_CARE_PROVIDER_SITE_OTHER): Payer: No Typology Code available for payment source | Admitting: Family Medicine

## 2022-05-20 ENCOUNTER — Encounter: Payer: Self-pay | Admitting: Family Medicine

## 2022-05-20 VITALS — BP 126/78 | HR 86 | Temp 98.0°F | Ht 63.0 in | Wt 195.8 lb

## 2022-05-20 DIAGNOSIS — T63331A Toxic effect of venom of brown recluse spider, accidental (unintentional), initial encounter: Secondary | ICD-10-CM | POA: Diagnosis not present

## 2022-05-20 MED ORDER — DOXYCYCLINE HYCLATE 100 MG PO CAPS
100.0000 mg | ORAL_CAPSULE | Freq: Two times a day (BID) | ORAL | 0 refills | Status: DC
Start: 2022-05-20 — End: 2022-12-02

## 2022-05-20 MED ORDER — DAPSONE 100 MG PO TABS: 100.0000 mg | ORAL_TABLET | Freq: Every day | ORAL | 0 refills | Status: DC

## 2022-09-11 ENCOUNTER — Other Ambulatory Visit: Payer: Self-pay | Admitting: Family

## 2022-09-11 DIAGNOSIS — K219 Gastro-esophageal reflux disease without esophagitis: Secondary | ICD-10-CM

## 2022-10-27 ENCOUNTER — Telehealth: Payer: Self-pay | Admitting: Family

## 2022-10-27 DIAGNOSIS — I10 Essential (primary) hypertension: Secondary | ICD-10-CM

## 2022-10-27 DIAGNOSIS — F410 Panic disorder [episodic paroxysmal anxiety] without agoraphobia: Secondary | ICD-10-CM

## 2022-10-27 DIAGNOSIS — K219 Gastro-esophageal reflux disease without esophagitis: Secondary | ICD-10-CM

## 2022-10-27 DIAGNOSIS — F411 Generalized anxiety disorder: Secondary | ICD-10-CM

## 2022-10-27 MED ORDER — PAROXETINE HCL 40 MG PO TABS: 40.0000 mg | ORAL_TABLET | Freq: Every morning | ORAL | 0 refills | Status: DC

## 2022-10-27 MED ORDER — HYDROCHLOROTHIAZIDE 25 MG PO TABS: 25.0000 mg | ORAL_TABLET | Freq: Every day | ORAL | 0 refills | Status: DC

## 2022-10-27 MED ORDER — BUSPIRONE HCL 5 MG PO TABS: 5.0000 mg | ORAL_TABLET | Freq: Three times a day (TID) | ORAL | 0 refills | Status: DC | PRN

## 2022-10-27 MED ORDER — OMEPRAZOLE 20 MG PO CPDR: 20.0000 mg | DELAYED_RELEASE_CAPSULE | Freq: Every day | ORAL | 0 refills | Status: DC

## 2022-10-27 NOTE — Telephone Encounter (Signed)
30 day supply sent

## 2022-10-27 NOTE — Telephone Encounter (Signed)
  Prescription Request  10/27/2022   What is the name of the medication or equipment? ALL  Have you contacted your pharmacy to request a refill? YES  Which pharmacy would you like this sent to? [facility], [place]  Pt is scheduled to see PCP on 12/02/22 for med refill (first available). Needs refills sent in to last her until appt.

## 2022-11-25 ENCOUNTER — Other Ambulatory Visit: Payer: Self-pay | Admitting: Family

## 2022-11-25 DIAGNOSIS — I10 Essential (primary) hypertension: Secondary | ICD-10-CM

## 2022-11-25 DIAGNOSIS — F411 Generalized anxiety disorder: Secondary | ICD-10-CM

## 2022-11-25 DIAGNOSIS — K219 Gastro-esophageal reflux disease without esophagitis: Secondary | ICD-10-CM

## 2022-11-25 DIAGNOSIS — F410 Panic disorder [episodic paroxysmal anxiety] without agoraphobia: Secondary | ICD-10-CM

## 2022-12-02 ENCOUNTER — Encounter: Payer: Self-pay | Admitting: Family

## 2022-12-02 ENCOUNTER — Ambulatory Visit (INDEPENDENT_AMBULATORY_CARE_PROVIDER_SITE_OTHER): Payer: 59 | Admitting: Family

## 2022-12-02 VITALS — BP 117/81 | HR 79 | Temp 98.0°F | Ht 63.0 in | Wt 199.4 lb

## 2022-12-02 DIAGNOSIS — J029 Acute pharyngitis, unspecified: Secondary | ICD-10-CM | POA: Diagnosis not present

## 2022-12-02 DIAGNOSIS — F410 Panic disorder [episodic paroxysmal anxiety] without agoraphobia: Secondary | ICD-10-CM | POA: Diagnosis not present

## 2022-12-02 DIAGNOSIS — K219 Gastro-esophageal reflux disease without esophagitis: Secondary | ICD-10-CM | POA: Diagnosis not present

## 2022-12-02 DIAGNOSIS — I10 Essential (primary) hypertension: Secondary | ICD-10-CM | POA: Diagnosis not present

## 2022-12-02 DIAGNOSIS — Z0000 Encounter for general adult medical examination without abnormal findings: Secondary | ICD-10-CM | POA: Diagnosis not present

## 2022-12-02 DIAGNOSIS — Z6835 Body mass index (BMI) 35.0-35.9, adult: Secondary | ICD-10-CM

## 2022-12-02 DIAGNOSIS — F17210 Nicotine dependence, cigarettes, uncomplicated: Secondary | ICD-10-CM

## 2022-12-02 DIAGNOSIS — R69 Illness, unspecified: Secondary | ICD-10-CM | POA: Diagnosis not present

## 2022-12-02 DIAGNOSIS — Z0001 Encounter for general adult medical examination with abnormal findings: Secondary | ICD-10-CM

## 2022-12-02 DIAGNOSIS — F411 Generalized anxiety disorder: Secondary | ICD-10-CM

## 2022-12-02 DIAGNOSIS — E785 Hyperlipidemia, unspecified: Secondary | ICD-10-CM | POA: Diagnosis not present

## 2022-12-02 DIAGNOSIS — J069 Acute upper respiratory infection, unspecified: Secondary | ICD-10-CM | POA: Diagnosis not present

## 2022-12-02 DIAGNOSIS — F17200 Nicotine dependence, unspecified, uncomplicated: Secondary | ICD-10-CM

## 2022-12-02 LAB — CULTURE, GROUP A STREP

## 2022-12-02 LAB — LIPID PANEL

## 2022-12-02 LAB — RAPID STREP SCREEN (MED CTR MEBANE ONLY): Strep Gp A Ag, IA W/Reflex: NEGATIVE

## 2022-12-02 LAB — VERITOR FLU A/B WAIVED
Influenza A: NEGATIVE
Influenza B: NEGATIVE

## 2022-12-02 MED ORDER — BUSPIRONE HCL 5 MG PO TABS: 5.0000 mg | ORAL_TABLET | Freq: Three times a day (TID) | ORAL | 0 refills | Status: DC | PRN

## 2022-12-02 MED ORDER — HYDROCHLOROTHIAZIDE 25 MG PO TABS: 25.0000 mg | ORAL_TABLET | Freq: Every day | ORAL | 0 refills | Status: DC

## 2022-12-02 MED ORDER — PAROXETINE HCL 40 MG PO TABS: 40.0000 mg | ORAL_TABLET | Freq: Every morning | ORAL | 0 refills | Status: DC

## 2022-12-02 MED ORDER — OMEPRAZOLE 20 MG PO CPDR: 20.0000 mg | DELAYED_RELEASE_CAPSULE | Freq: Every day | ORAL | 0 refills | Status: DC

## 2022-12-02 NOTE — Patient Instructions (Signed)

## 2022-12-02 NOTE — Progress Notes (Signed)
Subjective:    Patient ID: Kathryn Mitchell, female    DOB: 11-14-1971, 52 y.o.   MRN: [id number]  Chief Complaint  Patient presents with   Medical Management of Chronic Issues   Sore Throat    Started yesterday   Nasal Congestion   ears popping    PT presents to the office today for  CPE and chronic follow up. She is followed by GYN annually and her pap and mammogram is up to date. She reports her husband is addicted to Meth and this is a huge stressor, but is doing better at this time.    She is morbid obese with a BMI of 35 with HTN and Hyperlipidemia.    Complaining of sore throat that started yesterday. Did a home COVID yesterday that was negative.  Sore Throat  This is a new problem. The current episode started yesterday. The problem has been unchanged. There has been no fever. The pain is at a severity of 9/10. The pain is mild. Associated symptoms include congestion, ear pain, headaches and trouble swallowing. Pertinent negatives include no coughing, shortness of breath or swollen glands. She has tried NSAIDs for the symptoms. The treatment provided mild relief.  Hypertension This is a chronic problem. The current episode started more than 1 year ago. The problem has been resolved since onset. The problem is controlled. Associated symptoms include anxiety, headaches and malaise/fatigue. Pertinent negatives include no peripheral edema or shortness of breath. Risk factors for coronary artery disease include sedentary lifestyle. The current treatment provides moderate improvement.  Gastroesophageal Reflux She complains of belching and heartburn. She reports no coughing. This is a chronic problem. The current episode started more than 1 year ago. The problem occurs rarely. Risk factors include obesity. She has tried a PPI for the symptoms. The treatment provided moderate relief.  Hyperlipidemia This is a chronic problem. The current episode started more than 1 year ago. The problem is  uncontrolled. Exacerbating diseases include obesity. Pertinent negatives include no shortness of breath. Current antihyperlipidemic treatment includes diet change. The current treatment provides no improvement of lipids. Risk factors for coronary artery disease include dyslipidemia, hypertension and a sedentary lifestyle.  Anxiety Presents for follow-up visit. Symptoms include decreased concentration, depressed mood, nervous/anxious behavior and restlessness. Patient reports no insomnia, irritability or shortness of breath. Symptoms occur occasionally. The severity of symptoms is moderate.    Nicotine Dependence Presents for follow-up visit. Symptoms include decreased concentration. Symptoms are negative for insomnia and irritability. Her urge triggers include company of smokers. The symptoms have been stable. She smokes 1 pack of cigarettes per day.      Review of Systems  Constitutional:  Positive for malaise/fatigue. Negative for irritability.  HENT:  Positive for congestion, ear pain and trouble swallowing.   Respiratory:  Negative for cough and shortness of breath.   Gastrointestinal:  Positive for heartburn.  Neurological:  Positive for headaches.  Psychiatric/Behavioral:  Positive for decreased concentration. The patient is nervous/anxious. The patient does not have insomnia.   All other systems reviewed and are negative.  Family History  Problem Relation Age of Onset   Cancer Mother    Liver disease Father    Social History   Socioeconomic History   Marital status: Married    Spouse name: Not on file   Number of children: Not on file   Years of education: Not on file   Highest education level: Not on file  Occupational History   Not on file  Tobacco Use   Smoking status: Former   Smokeless tobacco: Never  Building services engineer Use: Never used  Substance and Sexual Activity   Alcohol use: Not on file   Drug use: Not on file   Sexual activity: Not on file  Other Topics  Concern   Not on file  Social History Narrative   Not on file   Social Determinants of Health   Financial Resource Strain: Not on file  Food Insecurity: Not on file  Transportation Needs: Not on file  Physical Activity: Not on file  Stress: Not on file  Social Connections: Not on file       Objective:   Physical Exam Vitals reviewed.  Constitutional:      General: She is not in acute distress.    Appearance: She is well-developed.  HENT:     Head: Normocephalic and atraumatic.     Right Ear: External ear normal.     Mouth/Throat:     Pharynx: Posterior oropharyngeal erythema present.     Tonsils: Tonsillar exudate present.  Eyes:     Pupils: Pupils are equal, round, and reactive to light.  Neck:     Thyroid: No thyromegaly.  Cardiovascular:     Rate and Rhythm: Normal rate and regular rhythm.     Heart sounds: Normal heart sounds. No murmur heard. Pulmonary:     Effort: Pulmonary effort is normal. No respiratory distress.     Breath sounds: Normal breath sounds. No wheezing.  Abdominal:     General: Bowel sounds are normal. There is no distension.     Palpations: Abdomen is soft.     Tenderness: There is no abdominal tenderness.  Musculoskeletal:        General: No tenderness. Normal range of motion.     Cervical back: Normal range of motion and neck supple.  Skin:    General: Skin is warm and dry.  Neurological:     Mental Status: She is alert and oriented to person, place, and time.     Cranial Nerves: No cranial nerve deficit.     Deep Tendon Reflexes: Reflexes are normal and symmetric.  Psychiatric:        Behavior: Behavior normal.        Thought Content: Thought content normal.        Judgment: Judgment normal.       BP 117/81   Pulse 79   Temp 98 F (36.7 C) (Temporal)   Ht 5\' 3"  (1.6 m)   Wt 199 lb 6.4 oz (90.4 kg)   LMP 01/01/2017   SpO2 91%   BMI 35.32 kg/m      Assessment & Plan:  Halah Whiteside Mckey comes in today with chief complaint  of Medical Management of Chronic Issues, Sore Throat (Started yesterday), Nasal Congestion, and ears popping    Diagnosis and orders addressed:  1. Sore throat - Veritor Flu A/B Waived - Rapid Strep Screen ([facility] ONLY) - CMP14+EGFR  2. Annual physical exam - CMP14+EGFR - CBC with Differential/Platelet - Lipid panel - TSH  3. Panic attacks - CMP14+EGFR - PARoxetine (PAXIL) 40 MG tablet; Take 1 tablet (40 mg total) by mouth in the morning.  Dispense: 90 tablet; Refill: 0 - busPIRone (BUSPAR) 5 MG tablet; Take 1 tablet (5 mg total) by mouth 3 (three) times daily as needed.  Dispense: 270 tablet; Refill: 0  4. Morbid obesity (HCC) - CMP14+EGFR  5. Hyperlipidemia, unspecified hyperlipidemia type - CMP14+EGFR -  Lipid panel  6. Essential hypertension, benign - CMP14+EGFR - omeprazole (PRILOSEC) 20 MG capsule; Take 1 capsule (20 mg total) by mouth daily.  Dispense: 90 capsule; Refill: 0 - hydrochlorothiazide (HYDRODIURIL) 25 MG tablet; Take 1 tablet (25 mg total) by mouth daily.  Dispense: 90 tablet; Refill: 0  7. GAD (generalized anxiety disorder) - CMP14+EGFR - PARoxetine (PAXIL) 40 MG tablet; Take 1 tablet (40 mg total) by mouth in the morning.  Dispense: 90 tablet; Refill: 0 - busPIRone (BUSPAR) 5 MG tablet; Take 1 tablet (5 mg total) by mouth 3 (three) times daily as needed.  Dispense: 270 tablet; Refill: 0  8. Gastroesophageal reflux disease, unspecified whether esophagitis present - CMP14+EGFR - omeprazole (PRILOSEC) 20 MG capsule; Take 1 capsule (20 mg total) by mouth daily.  Dispense: 90 capsule; Refill: 0  9. Current smoker   10. Viral URI Start zyrtec 10 mg  Continue flonase Start mucinex  - Take meds as prescribed - Use a cool mist humidifier  -Use saline nose sprays frequently -Force fluids -For any cough or congestion  Use plain Mucinex- regular strength or max strength is fine -For fever or aces or pains- take tylenol or ibuprofen. -Throat  lozenges if help -Follow up if symptoms worsen or do not improve    Labs pending Health Maintenance reviewed Diet and exercise encouraged  Follow up plan: 6 months   Evelina Dun, FNP

## 2022-12-03 ENCOUNTER — Telehealth: Payer: Self-pay | Admitting: Family

## 2022-12-03 LAB — CBC WITH DIFFERENTIAL/PLATELET
Basophils Absolute: 0 10*3/uL (ref 0.0–0.2)
Basos: 0 %
EOS (ABSOLUTE): 0.1 10*3/uL (ref 0.0–0.4)
Eos: 2 %
Hematocrit: 44.1 % (ref 34.0–46.6)
Hemoglobin: 14.8 g/dL (ref 11.1–15.9)
Immature Grans (Abs): 0 10*3/uL (ref 0.0–0.1)
Immature Granulocytes: 0 %
Lymphocytes Absolute: 1.6 10*3/uL (ref 0.7–3.1)
Lymphs: 24 %
MCH: 29.2 pg (ref 26.6–33.0)
MCHC: 33.6 g/dL (ref 31.5–35.7)
MCV: 87 fL (ref 79–97)
Monocytes Absolute: 0.3 10*3/uL (ref 0.1–0.9)
Monocytes: 5 %
Neutrophils Absolute: 4.7 10*3/uL (ref 1.4–7.0)
Neutrophils: 69 %
Platelets: 256 10*3/uL (ref 150–450)
RBC: 5.06 x10E6/uL (ref 3.77–5.28)
RDW: 13.1 % (ref 11.7–15.4)
WBC: 6.9 10*3/uL (ref 3.4–10.8)

## 2022-12-03 LAB — LIPID PANEL
Chol/HDL Ratio: 5.4 ratio — ABNORMAL HIGH (ref 0.0–4.4)
Cholesterol, Total: 264 mg/dL — ABNORMAL HIGH (ref 100–199)
HDL: 49 mg/dL (ref >39)
LDL Chol Calc (NIH): 176 mg/dL — ABNORMAL HIGH (ref 0–99)
Triglycerides: 210 mg/dL — ABNORMAL HIGH (ref 0–149)
VLDL Cholesterol Cal: 39 mg/dL (ref 5–40)

## 2022-12-03 LAB — CMP14+EGFR
ALT: 18 IU/L (ref 0–32)
AST: 20 IU/L (ref 0–40)
Albumin/Globulin Ratio: 1.4 (ref 1.2–2.2)
Albumin: 3.8 g/dL (ref 3.8–4.9)
Alkaline Phosphatase: 106 IU/L (ref 44–121)
BUN/Creatinine Ratio: 14 (ref 9–23)
BUN: 9 mg/dL (ref 6–24)
Bilirubin Total: 0.3 mg/dL (ref 0.0–1.2)
CO2: 24 mmol/L (ref 20–29)
Calcium: 9.2 mg/dL (ref 8.7–10.2)
Chloride: 102 mmol/L (ref 96–106)
Creatinine, Ser: 0.66 mg/dL (ref 0.57–1.00)
Globulin, Total: 2.8 g/dL (ref 1.5–4.5)
Glucose: 111 mg/dL — ABNORMAL HIGH (ref 70–99)
Potassium: 3.9 mmol/L (ref 3.5–5.2)
Sodium: 141 mmol/L (ref 134–144)
Total Protein: 6.6 g/dL (ref 6.0–8.5)
eGFR: 105 mL/min/{1.73_m2} (ref >59)

## 2022-12-03 LAB — TSH: TSH: 1.95 u[IU]/mL (ref 0.450–4.500)

## 2022-12-03 NOTE — Telephone Encounter (Signed)
Pt ok to wait until Alyse Low returns on 1/11.

## 2022-12-04 ENCOUNTER — Other Ambulatory Visit: Payer: Self-pay | Admitting: Family

## 2022-12-04 MED ORDER — ROSUVASTATIN CALCIUM 5 MG PO TABS: 5.0000 mg | ORAL_TABLET | Freq: Every day | ORAL | 3 refills | Status: DC

## 2022-12-04 NOTE — Telephone Encounter (Signed)
See result note.  

## 2023-01-05 ENCOUNTER — Other Ambulatory Visit: Payer: Self-pay | Admitting: Family Medicine

## 2023-01-28 ENCOUNTER — Other Ambulatory Visit: Payer: Self-pay | Admitting: Family

## 2023-01-28 DIAGNOSIS — F410 Panic disorder [episodic paroxysmal anxiety] without agoraphobia: Secondary | ICD-10-CM

## 2023-01-28 DIAGNOSIS — F411 Generalized anxiety disorder: Secondary | ICD-10-CM

## 2023-03-19 ENCOUNTER — Other Ambulatory Visit: Payer: Self-pay | Admitting: Family

## 2023-03-19 DIAGNOSIS — K219 Gastro-esophageal reflux disease without esophagitis: Secondary | ICD-10-CM

## 2023-03-19 DIAGNOSIS — I10 Essential (primary) hypertension: Secondary | ICD-10-CM

## 2023-04-01 ENCOUNTER — Other Ambulatory Visit: Payer: Self-pay | Admitting: Family

## 2023-04-01 DIAGNOSIS — F410 Panic disorder [episodic paroxysmal anxiety] without agoraphobia: Secondary | ICD-10-CM

## 2023-04-01 DIAGNOSIS — F411 Generalized anxiety disorder: Secondary | ICD-10-CM

## 2023-04-06 ENCOUNTER — Telehealth: Payer: Self-pay

## 2023-04-06 NOTE — Telephone Encounter (Signed)
Rosuvastatin Calcium 5MG  tablets  Key: [id number]  PA Case ID #: [id number] Rx #: [id number]  Sent to plan

## 2023-04-09 NOTE — Telephone Encounter (Signed)
PA denied:  Criteria for medical necessity were not met. Your records with [place] and the information submitted by your doctor do not show you meet the following criteria: (1) trial and failure or are unable to use atorvastatin 40 milligrams (mg) to 80 mg daily Please talk to your doctor about the treatment for hyperlipidemia (a condition in which the body has high levels of lipids or fats)

## 2023-04-13 ENCOUNTER — Telehealth: Payer: Self-pay

## 2023-04-13 NOTE — Telephone Encounter (Signed)
Kathryn Mitchell (KeyMaudry Mayhew) PA Case ID #: [id number] Rx #: [id number]  Rosuvastatin - PA sent to insurance on 04/13/2023

## 2023-04-16 MED ORDER — ATORVASTATIN CALCIUM 20 MG PO TABS: 20.0000 mg | ORAL_TABLET | Freq: Every day | ORAL | 3 refills | Status: DC

## 2023-04-16 NOTE — Telephone Encounter (Signed)
Crestor changed to Lipitor per insurance.   Kathryn Rodney, FNP

## 2023-04-16 NOTE — Addendum Note (Signed)
Addended by: Jannifer Rodney A on: 04/16/2023 01:00 PM   Modules accepted: Orders

## 2023-04-28 ENCOUNTER — Other Ambulatory Visit: Payer: Self-pay | Admitting: Family

## 2023-04-28 DIAGNOSIS — I10 Essential (primary) hypertension: Secondary | ICD-10-CM

## 2023-06-04 ENCOUNTER — Encounter: Payer: Self-pay | Admitting: Family

## 2023-06-04 ENCOUNTER — Ambulatory Visit: Payer: 59 | Admitting: Family

## 2023-06-04 VITALS — BP 117/80 | HR 77 | Temp 97.7°F | Ht 63.0 in | Wt 191.6 lb

## 2023-06-04 DIAGNOSIS — I10 Essential (primary) hypertension: Secondary | ICD-10-CM | POA: Diagnosis not present

## 2023-06-04 DIAGNOSIS — Z1211 Encounter for screening for malignant neoplasm of colon: Secondary | ICD-10-CM

## 2023-06-04 DIAGNOSIS — F410 Panic disorder [episodic paroxysmal anxiety] without agoraphobia: Secondary | ICD-10-CM | POA: Diagnosis not present

## 2023-06-04 DIAGNOSIS — K219 Gastro-esophageal reflux disease without esophagitis: Secondary | ICD-10-CM | POA: Diagnosis not present

## 2023-06-04 DIAGNOSIS — R5383 Other fatigue: Secondary | ICD-10-CM

## 2023-06-04 DIAGNOSIS — E7800 Pure hypercholesterolemia, unspecified: Secondary | ICD-10-CM

## 2023-06-04 DIAGNOSIS — F17200 Nicotine dependence, unspecified, uncomplicated: Secondary | ICD-10-CM

## 2023-06-04 DIAGNOSIS — F411 Generalized anxiety disorder: Secondary | ICD-10-CM

## 2023-06-04 MED ORDER — VARENICLINE TARTRATE 1 MG PO TABS: 1.0000 mg | ORAL_TABLET | Freq: Two times a day (BID) | ORAL | 2 refills | Status: DC

## 2023-06-04 MED ORDER — ATORVASTATIN CALCIUM 20 MG PO TABS
20.0000 mg | ORAL_TABLET | Freq: Every day | ORAL | 3 refills | Status: DC
Start: 2023-06-04 — End: 2023-12-04

## 2023-06-04 MED ORDER — PAROXETINE HCL 40 MG PO TABS: 20.0000 mg | ORAL_TABLET | Freq: Every day | ORAL | 1 refills | Status: DC

## 2023-06-04 MED ORDER — OMEPRAZOLE 20 MG PO CPDR: 20.0000 mg | DELAYED_RELEASE_CAPSULE | Freq: Every day | ORAL | 1 refills | Status: DC

## 2023-06-04 MED ORDER — VARENICLINE TARTRATE (STARTER) 0.5 MG X 11 & 1 MG X 42 PO TBPK
ORAL_TABLET | ORAL | 0 refills | Status: DC
Start: 2023-06-04 — End: 2023-12-04

## 2023-06-04 MED ORDER — HYDROCHLOROTHIAZIDE 25 MG PO TABS: 25.0000 mg | ORAL_TABLET | Freq: Every day | ORAL | 1 refills | Status: DC

## 2023-06-04 NOTE — Patient Instructions (Addendum)
Fatigue If you have fatigue, you feel tired all the time and have a lack of energy or a lack of motivation. Fatigue may make it difficult to start or complete tasks because of exhaustion. Occasional or mild fatigue is often a normal response to activity or life. However, long-term (chronic) or extreme fatigue may be a symptom of a medical condition such as: Depression. Not having enough red blood cells or hemoglobin in the blood (anemia). A problem with a small gland located in the lower front part of the neck (thyroid disorder). Rheumatologic conditions. These are problems related to the body's defense system (immune system). Infections, especially certain viral infections. Fatigue can also lead to negative health outcomes over time. Follow these instructions at home: Medicines Take over-the-counter and prescription medicines only as told by your health care provider. Take a multivitamin if told by your health care provider. Do not use herbal or dietary supplements unless they are approved by your health care provider. Eating and drinking  Avoid heavy meals in the evening. Eat a well-balanced diet, which includes lean proteins, whole grains, plenty of fruits and vegetables, and low-fat dairy products. Avoid eating or drinking too many products with caffeine in them. Avoid alcohol. Drink enough fluid to keep your urine pale yellow. Activity  Exercise regularly, as told by your health care provider. Use or practice techniques to help you relax, such as yoga, tai chi, meditation, or massage therapy. Lifestyle Change situations that cause you stress. Try to keep your work and personal schedules in balance. Do not use recreational or illegal drugs. General instructions Monitor your fatigue for any changes. Go to bed and get up at the same time every day. Avoid fatigue by pacing yourself during the day and getting enough sleep at night. Maintain a healthy weight. Contact a health care  provider if: Your fatigue does not get better. You have a fever. You suddenly lose or gain weight. You have headaches. You have trouble falling asleep or sleeping through the night. You feel angry, guilty, anxious, or sad. You have swelling in your legs or another part of your body. Get help right away if: You feel confused, feel like you might faint, or faint. Your vision is blurry or you have a severe headache. You have severe pain in your abdomen, your back, or the area between your waist and hips (pelvis). You have chest pain, shortness of breath, or an irregular or fast heartbeat. You are unable to urinate, or you urinate less than normal. You have abnormal bleeding from the rectum, nose, lungs, nipples, or, if you are female, the vagina. You vomit blood. You have thoughts about hurting yourself or others. These symptoms may be an emergency. Get help right away. Call 911. Do not wait to see if the symptoms will go away. Do not drive yourself to the hospital. Get help right away if you feel like you may hurt yourself or others, or have thoughts about taking your own life. Go to your nearest emergency room or: Call 911. Call the National Suicide Prevention Lifeline at 1-800-273-8255 or 988. This is open 24 hours a day. Text the Crisis Text Line at 741741. Summary If you have fatigue, you feel tired all the time and have a lack of energy or a lack of motivation. Fatigue may make it difficult to start or complete tasks because of exhaustion. Long-term (chronic) or extreme fatigue may be a symptom of a medical condition. Exercise regularly, as told by your health care provider.   Change situations that cause you stress. Try to keep your work and personal schedules in balance. This information is not intended to replace advice given to you by your health care provider. Make sure you discuss any questions you have with your health care provider. Document Revised: 09/02/2021 Document  Reviewed: 09/02/2021 Elsevier Patient Education  2024 [employer]  

## 2023-06-04 NOTE — Progress Notes (Signed)
Subjective:    Patient ID: Kathryn Mitchell, female    DOB: [birth year], 52 y.o.   MRN: [id number]  Chief Complaint  Patient presents with   Medical Management of Chronic Issues    fasting   Fatigue    All the time    PT presents to the office today for chronic follow up. She is followed by GYN annually and her pap and mammogram is up to date.   She reports her husband is addicted to Meth and this is a huge stressor, but is doing better at this time.     She is obese with a BMI of 33 with HTN and Hyperlipidemia. She has lost 9 lb since our last visit. Reports she has changed her diet and been more active.      06/04/2023    8:11 AM 12/02/2022    9:36 AM 05/20/2022    8:49 AM  Last 3 Weights  Weight (lbs) 191 lb 9.6 oz 199 lb 6.4 oz 195 lb 12.8 oz  Weight (kg) 86.909 kg 90.447 kg 88.814 kg    She is complaining of increased fatigue.  Hypertension This is a chronic problem. The current episode started more than 1 year ago. The problem has been resolved since onset. The problem is controlled. Associated symptoms include anxiety and malaise/fatigue. Pertinent negatives include no peripheral edema or shortness of breath. Risk factors for coronary artery disease include obesity, sedentary lifestyle, smoking/tobacco exposure and dyslipidemia. The current treatment provides moderate improvement.  Gastroesophageal Reflux She complains of belching and heartburn. This is a chronic problem. The current episode started more than 1 year ago. The problem occurs occasionally. Risk factors include obesity. She has tried a PPI for the symptoms. The treatment provided moderate relief.  Hyperlipidemia This is a chronic problem. The current episode started more than 1 year ago. The problem is controlled. Recent lipid tests were reviewed and are normal. Exacerbating diseases include obesity. She has no history of nephrotic syndrome. Pertinent negatives include no shortness of breath. Current antihyperlipidemic  treatment includes statins. The current treatment provides mild improvement of lipids. Risk factors for coronary artery disease include dyslipidemia, hypertension and a sedentary lifestyle.  Anxiety Presents for follow-up visit. Symptoms include excessive worry and nervous/anxious behavior. Patient reports no shortness of breath. Symptoms occur occasionally. The severity of symptoms is mild.    Nicotine Dependence Presents for follow-up visit. Her urge triggers include company of smokers. The symptoms have been stable. She smokes 1 pack of cigarettes per day.     Review of Systems  Constitutional:  Positive for malaise/fatigue.  Respiratory:  Negative for shortness of breath.   Gastrointestinal:  Positive for heartburn.  Psychiatric/Behavioral:  The patient is nervous/anxious.   All other systems reviewed and are negative.      Objective:   Physical Exam Vitals reviewed.  Constitutional:      General: She is not in acute distress.    Appearance: She is well-developed.  HENT:     Head: Normocephalic and atraumatic.     Right Ear: Tympanic membrane normal.     Left Ear: Tympanic membrane normal.  Eyes:     Pupils: Pupils are equal, round, and reactive to light.  Neck:     Thyroid: No thyromegaly.  Cardiovascular:     Rate and Rhythm: Normal rate and regular rhythm.     Heart sounds: Normal heart sounds. No murmur heard. Pulmonary:     Effort: Pulmonary effort is normal. No respiratory distress.  Breath sounds: Normal breath sounds. No wheezing.  Abdominal:     General: Bowel sounds are normal. There is no distension.     Palpations: Abdomen is soft.     Tenderness: There is no abdominal tenderness.  Musculoskeletal:        General: No tenderness. Normal range of motion.     Cervical back: Normal range of motion and neck supple.  Skin:    General: Skin is warm and dry.  Neurological:     Mental Status: She is alert and oriented to person, place, and time.     Cranial  Nerves: No cranial nerve deficit.     Deep Tendon Reflexes: Reflexes are normal and symmetric.  Psychiatric:        Behavior: Behavior normal.        Thought Content: Thought content normal.        Judgment: Judgment normal.      BP 117/80   Pulse 77   Temp 97.7 F (36.5 C) (Temporal)   Ht 5\' 3"  (1.6 m)   Wt 191 lb 9.6 oz (86.9 kg)   LMP 01/01/2017   SpO2 96%   BMI 33.94 kg/m      Assessment & Plan:  [name] comes in today with chief complaint of Medical Management of Chronic Issues (fasting) and Fatigue (All the time )   Diagnosis and orders addressed:  1. Essential hypertension, benign - hydrochlorothiazide (HYDRODIURIL) 25 MG tablet; Take 1 tablet (25 mg total) by mouth daily.  Dispense: 90 tablet; Refill: 1 - omeprazole (PRILOSEC) 20 MG capsule; Take 1 capsule (20 mg total) by mouth daily.  Dispense: 90 capsule; Refill: 1 - CMP14+EGFR - CBC with Differential/Platelet  2. Gastroesophageal reflux disease, unspecified whether esophagitis present - omeprazole (PRILOSEC) 20 MG capsule; Take 1 capsule (20 mg total) by mouth daily.  Dispense: 90 capsule; Refill: 1 - CMP14+EGFR - CBC with Differential/Platelet  3. Panic attacks - PARoxetine (PAXIL) 40 MG tablet; Take 0.5 tablets (20 mg total) by mouth daily.  Dispense: 90 tablet; Refill: 1 - CMP14+EGFR - CBC with Differential/Platelet  4. GAD (generalized anxiety disorder) - PARoxetine (PAXIL) 40 MG tablet; Take 0.5 tablets (20 mg total) by mouth daily.  Dispense: 90 tablet; Refill: 1 - CMP14+EGFR - CBC with Differential/Platelet  5. Needs smoking cessation education - CMP14+EGFR - CBC with Differential/Platelet - Varenicline Tartrate, Starter, (CHANTIX STARTING MONTH PAK) 0.5 MG X 11 & 1 MG X 42 TBPK; 0.5 mg PO qd X3 days, then 0.5 mg PO BID for 4. Then 1 mg BID.  Dispense: 53 each; Refill: 0 - varenicline (CHANTIX CONTINUING MONTH PAK) 1 MG tablet; Take 1 tablet (1 mg total) by mouth 2 (two) times daily.   Dispense: 60 tablet; Refill: 2  6. Colon cancer screening - Cologuard - CMP14+EGFR - CBC with Differential/Platelet  7. Pure hypercholesterolemia - atorvastatin (LIPITOR) 20 MG tablet; Take 1 tablet (20 mg total) by mouth daily.  Dispense: 90 tablet; Refill: 3  8. Other fatigue Encourage healthy diet and exercise   Labs pending Chantix started today for smoking cessation. Discussed stopping if SI thoughts. Discussed how to take medication. Pt will start medication without quit day.  Has pap scheduled next month Health Maintenance reviewed Diet and exercise encouraged  Follow up plan: 6 months    Jannifer Rodney, FNP

## 2023-06-05 LAB — CMP14+EGFR
ALT: 23 IU/L (ref 0–32)
AST: 23 IU/L (ref 0–40)
Albumin: 3.8 g/dL (ref 3.8–4.9)
Alkaline Phosphatase: 119 IU/L (ref 44–121)
BUN/Creatinine Ratio: 9 (ref 9–23)
BUN: 7 mg/dL (ref 6–24)
Bilirubin Total: 0.4 mg/dL (ref 0.0–1.2)
CO2: 26 mmol/L (ref 20–29)
Calcium: 9.4 mg/dL (ref 8.7–10.2)
Chloride: 99 mmol/L (ref 96–106)
Creatinine, Ser: 0.78 mg/dL (ref 0.57–1.00)
Globulin, Total: 2.8 g/dL (ref 1.5–4.5)
Glucose: 97 mg/dL (ref 70–99)
Potassium: 3.4 mmol/L — ABNORMAL LOW (ref 3.5–5.2)
Sodium: 141 mmol/L (ref 134–144)
Total Protein: 6.6 g/dL (ref 6.0–8.5)
eGFR: 91 mL/min/{1.73_m2} (ref >59)

## 2023-06-05 LAB — CBC WITH DIFFERENTIAL/PLATELET
Basophils Absolute: 0 10*3/uL (ref 0.0–0.2)
Basos: 0 %
EOS (ABSOLUTE): 0.1 10*3/uL (ref 0.0–0.4)
Eos: 2 %
Hematocrit: 45.1 % (ref 34.0–46.6)
Hemoglobin: 15.4 g/dL (ref 11.1–15.9)
Immature Grans (Abs): 0 10*3/uL (ref 0.0–0.1)
Immature Granulocytes: 0 %
Lymphocytes Absolute: 1.9 10*3/uL (ref 0.7–3.1)
Lymphs: 29 %
MCH: 30 pg (ref 26.6–33.0)
MCHC: 34.1 g/dL (ref 31.5–35.7)
MCV: 88 fL (ref 79–97)
Monocytes Absolute: 0.3 10*3/uL (ref 0.1–0.9)
Monocytes: 4 %
Neutrophils Absolute: 4.2 10*3/uL (ref 1.4–7.0)
Neutrophils: 65 %
Platelets: 259 10*3/uL (ref 150–450)
RBC: 5.14 x10E6/uL (ref 3.77–5.28)
RDW: 13.6 % (ref 11.7–15.4)
WBC: 6.4 10*3/uL (ref 3.4–10.8)

## 2023-06-23 ENCOUNTER — Encounter: Payer: Self-pay | Admitting: Family

## 2023-06-27 ENCOUNTER — Other Ambulatory Visit: Payer: Self-pay | Admitting: Family

## 2023-06-27 DIAGNOSIS — F411 Generalized anxiety disorder: Secondary | ICD-10-CM

## 2023-06-27 DIAGNOSIS — F410 Panic disorder [episodic paroxysmal anxiety] without agoraphobia: Secondary | ICD-10-CM

## 2023-10-09 ENCOUNTER — Telehealth: Payer: Self-pay | Admitting: Family Medicine

## 2023-10-09 MED ORDER — FLUTICASONE PROPIONATE 50 MCG/ACT NA SUSP: 2.0000 | Freq: Every day | NASAL | 6 refills | Status: DC

## 2023-10-09 NOTE — Telephone Encounter (Signed)
Flonase Prescription sent to pharmacy   

## 2023-10-09 NOTE — Telephone Encounter (Signed)
Copied from CRM [id number]. Topic: Clinical - Medication Question >> Oct 09, 2023  9:59 AM Deaijah H wrote: Reason for CRM: Pt would like to know if Dr. Could send in an order for fluticasone (FLONASE) 50 MCG/ACT nasal spray

## 2023-12-03 ENCOUNTER — Other Ambulatory Visit: Payer: Self-pay | Admitting: Family

## 2023-12-03 DIAGNOSIS — I10 Essential (primary) hypertension: Secondary | ICD-10-CM

## 2023-12-04 ENCOUNTER — Ambulatory Visit: Payer: 59 | Admitting: Family

## 2023-12-04 ENCOUNTER — Encounter: Payer: Self-pay | Admitting: Family

## 2023-12-04 VITALS — BP 101/72 | HR 81 | Temp 97.8°F | Ht 63.0 in | Wt 192.0 lb

## 2023-12-04 DIAGNOSIS — F411 Generalized anxiety disorder: Secondary | ICD-10-CM | POA: Diagnosis not present

## 2023-12-04 DIAGNOSIS — F410 Panic disorder [episodic paroxysmal anxiety] without agoraphobia: Secondary | ICD-10-CM

## 2023-12-04 DIAGNOSIS — E7800 Pure hypercholesterolemia, unspecified: Secondary | ICD-10-CM

## 2023-12-04 DIAGNOSIS — I10 Essential (primary) hypertension: Secondary | ICD-10-CM

## 2023-12-04 DIAGNOSIS — Z0000 Encounter for general adult medical examination without abnormal findings: Secondary | ICD-10-CM

## 2023-12-04 DIAGNOSIS — K219 Gastro-esophageal reflux disease without esophagitis: Secondary | ICD-10-CM

## 2023-12-04 DIAGNOSIS — Z1211 Encounter for screening for malignant neoplasm of colon: Secondary | ICD-10-CM

## 2023-12-04 DIAGNOSIS — R5383 Other fatigue: Secondary | ICD-10-CM

## 2023-12-04 DIAGNOSIS — Z0001 Encounter for general adult medical examination with abnormal findings: Secondary | ICD-10-CM

## 2023-12-04 DIAGNOSIS — F17200 Nicotine dependence, unspecified, uncomplicated: Secondary | ICD-10-CM

## 2023-12-04 MED ORDER — BUSPIRONE HCL 5 MG PO TABS: 5.0000 mg | ORAL_TABLET | Freq: Three times a day (TID) | ORAL | 0 refills | Status: DC | PRN

## 2023-12-04 MED ORDER — PAROXETINE HCL 40 MG PO TABS: 20.0000 mg | ORAL_TABLET | Freq: Every day | ORAL | 1 refills | Status: DC

## 2023-12-04 MED ORDER — ATORVASTATIN CALCIUM 20 MG PO TABS: 20.0000 mg | ORAL_TABLET | Freq: Every day | ORAL | 3 refills | Status: DC

## 2023-12-04 MED ORDER — OMEPRAZOLE 20 MG PO CPDR: 20.0000 mg | DELAYED_RELEASE_CAPSULE | Freq: Every day | ORAL | 1 refills | Status: DC

## 2023-12-04 MED ORDER — FLUTICASONE PROPIONATE 50 MCG/ACT NA SUSP: 2.0000 | Freq: Every day | NASAL | 6 refills | Status: DC

## 2023-12-04 NOTE — Patient Instructions (Signed)
 Dyslipidemia Dyslipidemia is an imbalance of waxy, fat-like substances (lipids) in the blood. The body needs lipids in small amounts. Dyslipidemia often involves a high level of cholesterol or triglycerides, which are types of lipids. Common forms of dyslipidemia include: High levels of LDL cholesterol. LDL is the type of cholesterol that causes fatty deposits (plaques) to build up in the blood vessels that carry blood away from the heart (arteries). Low levels of HDL cholesterol. HDL cholesterol is the type of cholesterol that protects against heart disease. High levels of HDL remove the LDL buildup from arteries. High levels of triglycerides. Triglycerides are a fatty substance in the blood that is linked to a buildup of plaques in the arteries. What are the causes? There are two main types of dyslipidemia: primary and secondary. Primary dyslipidemia is caused by changes (mutations) in genes that are passed down through families (inherited). These mutations cause several types of dyslipidemia. Secondary dyslipidemia may be caused by various risk factors that can lead to the disease, such as lifestyle choices and certain medical conditions. What increases the risk? You are more likely to develop this condition if you are an older man or if you are a woman who has gone through menopause. Other risk factors include: Having a family history of dyslipidemia. Taking certain medicines, including birth control pills, steroids, some diuretics, and beta-blockers. Eating a diet high in saturated fat. Smoking cigarettes or excessive alcohol intake. Having certain medical conditions such as diabetes, polycystic ovary syndrome (PCOS), kidney disease, liver disease, or hypothyroidism. Not exercising regularly. Being overweight or obese with too much belly fat. What are the signs or symptoms? In most cases, dyslipidemia does not usually cause any symptoms. In severe cases, very high lipid levels can  cause: Fatty bumps under the skin (xanthomas). A white or gray ring around the black center (pupil) of the eye. Very high triglyceride levels can cause inflammation of the pancreas (pancreatitis). How is this diagnosed? Your health care provider may diagnose dyslipidemia based on a routine blood test (fasting blood test). Because most people do not have symptoms of the condition, this blood testing (lipid profile) is done on adults age 54 and older and is repeated every 4-6 years. This test checks: Total cholesterol. This measures the total amount of cholesterol in your blood, including LDL cholesterol, HDL cholesterol, and triglycerides. A healthy number is below 200 mg/dL (4.09 mmol/L). LDL cholesterol. The target number for LDL cholesterol is different for each person, depending on individual risk factors. A healthy number is usually below 100 mg/dL (8.11 mmol/L). Ask your health care provider what your LDL cholesterol should be. HDL cholesterol. An HDL level of 60 mg/dL (9.14 mmol/L) or higher is best because it helps to protect against heart disease. A number below 40 mg/dL (7.82 mmol/L) for men or below 50 mg/dL (9.56 mmol/L) for women increases the risk for heart disease. Triglycerides. A healthy triglyceride number is below 150 mg/dL (2.13 mmol/L). If your lipid profile is abnormal, your health care provider may do other blood tests. How is this treated? Treatment depends on the type of dyslipidemia that you have and your other risk factors for heart disease and stroke. Your health care provider will have a target range for your lipid levels based on this information. Treatment for dyslipidemia starts with lifestyle changes, such as diet and exercise. Your health care provider may recommend that you: Get regular exercise. Make changes to your diet. Quit smoking if you smoke. Limit your alcohol intake. If diet  changes and exercise do not help you reach your goals, your health care provider  may also prescribe medicine to lower lipids. The most commonly prescribed type of medicine lowers your LDL cholesterol (statin drug). If you have a high triglyceride level, your provider may prescribe another type of drug (fibrate) or an omega-3 fish oil supplement, or both. Follow these instructions at home: Eating and drinking  Follow instructions from your health care provider or dietitian about eating or drinking restrictions. Eat a healthy diet as told by your health care provider. This can help you reach and maintain a healthy weight, lower your LDL cholesterol, and raise your HDL cholesterol. This may include: Limiting your calories, if you are overweight. Eating more fruits, vegetables, whole grains, fish, and lean meats. Limiting saturated fat, trans fat, and cholesterol. Do not drink alcohol if: Your health care provider tells you not to drink. You are pregnant, may be pregnant, or are planning to become pregnant. If you drink alcohol: Limit how much you have to: 0-1 drink a day for women. 0-2 drinks a day for men. Know how much alcohol is in your drink. In the [place] one drink equals one 12 oz bottle of beer (355 mL), one 5 oz glass of wine (148 mL), or one 1 oz glass of hard liquor (44 mL). Activity Get regular exercise. Start an exercise and strength training program as told by your health care provider. Ask your health care provider what activities are safe for you. Your health care provider may recommend: 30 minutes of aerobic activity 4-6 days a week. Brisk walking is an example of aerobic activity. Strength training 2 days a week. General instructions Do not use any products that contain nicotine or tobacco. These products include cigarettes, chewing tobacco, and vaping devices, such as e-cigarettes. If you need help quitting, ask your health care provider. Take over-the-counter and prescription medicines only as told by your health care provider. This includes  supplements. Keep all follow-up visits. This is important. Contact a health care provider if: You are having trouble sticking to your exercise or diet plan. You are struggling to quit smoking or to control your use of alcohol. Summary Dyslipidemia often involves a high level of cholesterol or triglycerides, which are types of lipids. Treatment depends on the type of dyslipidemia that you have and your other risk factors for heart disease and stroke. Treatment for dyslipidemia starts with lifestyle changes, such as diet and exercise. Your health care provider may prescribe medicine to lower lipids. This information is not intended to replace advice given to you by your health care provider. Make sure you discuss any questions you have with your health care provider. Document Revised: 06/13/2022 Document Reviewed: 01/14/2021 Elsevier Patient Education  2024 [employer].

## 2023-12-04 NOTE — Progress Notes (Signed)
 Subjective:    Patient ID: Kathryn Mitchell, female    DOB: June 17, 1971, 53 y.o.   MRN: [id number]  Chief Complaint  Patient presents with   Medical Management of Chronic Issues    , NO CONCERNS PATIENT IS FASTING, NYSTATIN  POWDER PATIENT WANTS    PT presents to the office today for CPE and chronic follow up. She is followed by GYN annually and her pap and mammogram is up to date.   She reports her husband is addicted to Meth and this is a huge stressor, but is doing better at this time.  She is expecting her first grandbaby in May.    She is obese with a BMI of 34 with HTN and Hyperlipidemia.      12/04/2023    8:05 AM 06/04/2023    8:11 AM 12/02/2022    9:36 AM  Last 3 Weights  Weight (lbs) 192 lb 191 lb 9.6 oz 199 lb 6.4 oz  Weight (kg) 87.091 kg 86.909 kg 90.447 kg     Hypertension This is a chronic problem. The current episode started more than 1 year ago. The problem has been resolved since onset. The problem is controlled. Associated symptoms include anxiety and malaise/fatigue. Pertinent negatives include no peripheral edema or shortness of breath. Risk factors for coronary artery disease include obesity, sedentary lifestyle, smoking/tobacco exposure and dyslipidemia. The current treatment provides moderate improvement.  Gastroesophageal Reflux She complains of belching and heartburn. This is a chronic problem. The current episode started more than 1 year ago. The problem occurs occasionally. Risk factors include obesity. She has tried a PPI for the symptoms. The treatment provided moderate relief.  Hyperlipidemia This is a chronic problem. The current episode started more than 1 year ago. The problem is controlled. Recent lipid tests were reviewed and are normal. Exacerbating diseases include obesity. She has no history of nephrotic syndrome. Pertinent negatives include no shortness of breath. Current antihyperlipidemic treatment includes statins. The current treatment provides  mild improvement of lipids. Risk factors for coronary artery disease include dyslipidemia, hypertension and a sedentary lifestyle.  Anxiety Presents for follow-up visit. Symptoms include excessive worry and nervous/anxious behavior. Patient reports no shortness of breath. Symptoms occur occasionally. The severity of symptoms is mild.    Nicotine Dependence Presents for follow-up visit. The symptoms have been stable. She smokes 1 pack of cigarettes per day.     Review of Systems  Constitutional:  Positive for malaise/fatigue.  Respiratory:  Negative for shortness of breath.   Gastrointestinal:  Positive for heartburn.  Psychiatric/Behavioral:  The patient is nervous/anxious.   All other systems reviewed and are negative.      Objective:   Physical Exam Vitals reviewed.  Constitutional:      General: She is not in acute distress.    Appearance: She is well-developed. She is obese.  HENT:     Head: Normocephalic and atraumatic.     Right Ear: Tympanic membrane normal.     Left Ear: Tympanic membrane normal.  Eyes:     Pupils: Pupils are equal, round, and reactive to light.  Neck:     Thyroid : No thyromegaly.  Cardiovascular:     Rate and Rhythm: Normal rate and regular rhythm.     Heart sounds: Normal heart sounds. No murmur heard. Pulmonary:     Effort: Pulmonary effort is normal. No respiratory distress.     Breath sounds: Normal breath sounds. No wheezing.  Abdominal:     General: Bowel sounds are  normal. There is no distension.     Palpations: Abdomen is soft.     Tenderness: There is no abdominal tenderness.  Musculoskeletal:        General: No tenderness. Normal range of motion.     Cervical back: Normal range of motion and neck supple.  Skin:    General: Skin is warm and dry.  Neurological:     Mental Status: She is alert and oriented to person, place, and time.     Cranial Nerves: No cranial nerve deficit.     Deep Tendon Reflexes: Reflexes are normal and  symmetric.  Psychiatric:        Behavior: Behavior normal.        Thought Content: Thought content normal.        Judgment: Judgment normal.      BP 101/72   Pulse 81   Temp 97.8 F (36.6 C) (Temporal)   Ht 5' 3 (1.6 m)   Wt 192 lb (87.1 kg)   LMP 01/01/2017   SpO2 94%   BMI 34.01 kg/m      Assessment & Plan:  Kathryn Mitchell comes in today with chief complaint of Medical Management of Chronic Issues ( , NO CONCERNS PATIENT IS FASTING, NYSTATIN  POWDER PATIENT WANTS )   Diagnosis and orders addressed:  1. Panic attacks - CBC with Differential/Platelet - CMP14+EGFR - busPIRone  (BUSPAR ) 5 MG tablet; Take 1 tablet (5 mg total) by mouth 3 (three) times daily as needed.  Dispense: 270 tablet; Refill: 0 - PARoxetine  (PAXIL ) 40 MG tablet; Take 0.5 tablets (20 mg total) by mouth daily.  Dispense: 90 tablet; Refill: 1  2. GAD (generalized anxiety disorder) - CBC with Differential/Platelet - CMP14+EGFR - busPIRone  (BUSPAR ) 5 MG tablet; Take 1 tablet (5 mg total) by mouth 3 (three) times daily as needed.  Dispense: 270 tablet; Refill: 0 - PARoxetine  (PAXIL ) 40 MG tablet; Take 0.5 tablets (20 mg total) by mouth daily.  Dispense: 90 tablet; Refill: 1  3. Essential hypertension, benign - CBC with Differential/Platelet - CMP14+EGFR  4. Gastroesophageal reflux disease, unspecified whether esophagitis present - CBC with Differential/Platelet - CMP14+EGFR - omeprazole  (PRILOSEC) 20 MG capsule; Take 1 capsule (20 mg total) by mouth daily.  Dispense: 90 capsule; Refill: 1  5. Annual physical exam (Primary) - CBC with Differential/Platelet - CMP14+EGFR - Lipid panel - TSH  6. Pure hypercholesterolemia - CBC with Differential/Platelet - CMP14+EGFR - atorvastatin  (LIPITOR) 20 MG tablet; Take 1 tablet (20 mg total) by mouth daily.  Dispense: 90 tablet; Refill: 3  7. Current smoker - CBC with Differential/Platelet - CMP14+EGFR  8. Colon cancer screening - Ambulatory referral  to Gastroenterology  9. Other fatigue - TSH   Labs pending Continue current medications  Keep specialists appointment  Health Maintenance reviewed Diet and exercise encouraged  Follow up plan: 6 months    Bari Learn, FNP

## 2023-12-05 LAB — CBC WITH DIFFERENTIAL/PLATELET
Basophils Absolute: 0 10*3/uL (ref 0.0–0.2)
Basos: 0 %
EOS (ABSOLUTE): 0.1 10*3/uL (ref 0.0–0.4)
Eos: 1 %
Hematocrit: 46.4 % (ref 34.0–46.6)
Hemoglobin: 15.5 g/dL (ref 11.1–15.9)
Immature Grans (Abs): 0 10*3/uL (ref 0.0–0.1)
Immature Granulocytes: 0 %
Lymphocytes Absolute: 2 10*3/uL (ref 0.7–3.1)
Lymphs: 28 %
MCH: 29.5 pg (ref 26.6–33.0)
MCHC: 33.4 g/dL (ref 31.5–35.7)
MCV: 88 fL (ref 79–97)
Monocytes Absolute: 0.3 10*3/uL (ref 0.1–0.9)
Monocytes: 4 %
Neutrophils Absolute: 4.8 10*3/uL (ref 1.4–7.0)
Neutrophils: 67 %
Platelets: 262 10*3/uL (ref 150–450)
RBC: 5.25 x10E6/uL (ref 3.77–5.28)
RDW: 13.2 % (ref 11.7–15.4)
WBC: 7.3 10*3/uL (ref 3.4–10.8)

## 2023-12-05 LAB — LIPID PANEL
Chol/HDL Ratio: 5.1 {ratio} — ABNORMAL HIGH (ref 0.0–4.4)
Cholesterol, Total: 264 mg/dL — ABNORMAL HIGH (ref 100–199)
HDL: 52 mg/dL (ref >39)
LDL Chol Calc (NIH): 179 mg/dL — ABNORMAL HIGH (ref 0–99)
Triglycerides: 179 mg/dL — ABNORMAL HIGH (ref 0–149)
VLDL Cholesterol Cal: 33 mg/dL (ref 5–40)

## 2023-12-05 LAB — TSH: TSH: 1.7 u[IU]/mL (ref 0.450–4.500)

## 2023-12-05 LAB — CMP14+EGFR
ALT: 17 [IU]/L (ref 0–32)
AST: 18 [IU]/L (ref 0–40)
Albumin: 3.9 g/dL (ref 3.8–4.9)
Alkaline Phosphatase: 119 [IU]/L (ref 44–121)
BUN/Creatinine Ratio: 11 (ref 9–23)
BUN: 9 mg/dL (ref 6–24)
Bilirubin Total: 0.5 mg/dL (ref 0.0–1.2)
CO2: 29 mmol/L (ref 20–29)
Calcium: 9.3 mg/dL (ref 8.7–10.2)
Chloride: 97 mmol/L (ref 96–106)
Creatinine, Ser: 0.8 mg/dL (ref 0.57–1.00)
Globulin, Total: 2.8 g/dL (ref 1.5–4.5)
Glucose: 104 mg/dL — ABNORMAL HIGH (ref 70–99)
Potassium: 3.2 mmol/L — ABNORMAL LOW (ref 3.5–5.2)
Sodium: 140 mmol/L (ref 134–144)
Total Protein: 6.7 g/dL (ref 6.0–8.5)
eGFR: 88 mL/min/{1.73_m2} (ref >59)

## 2023-12-07 ENCOUNTER — Other Ambulatory Visit: Payer: Self-pay | Admitting: Family

## 2023-12-07 MED ORDER — POTASSIUM CHLORIDE CRYS ER 20 MEQ PO TBCR: 20.0000 meq | EXTENDED_RELEASE_TABLET | Freq: Two times a day (BID) | ORAL | 1 refills | Status: DC

## 2023-12-09 ENCOUNTER — Encounter (INDEPENDENT_AMBULATORY_CARE_PROVIDER_SITE_OTHER): Payer: Self-pay | Admitting: *Deleted

## 2024-01-01 ENCOUNTER — Other Ambulatory Visit: Payer: Self-pay | Admitting: Family

## 2024-01-20 ENCOUNTER — Ambulatory Visit (INDEPENDENT_AMBULATORY_CARE_PROVIDER_SITE_OTHER): Payer: No Typology Code available for payment source | Admitting: Family Medicine

## 2024-01-20 ENCOUNTER — Ambulatory Visit: Payer: Self-pay | Admitting: Family

## 2024-01-20 ENCOUNTER — Encounter: Payer: Self-pay | Admitting: Family Medicine

## 2024-01-20 VITALS — BP 113/74 | HR 70 | Temp 98.7°F | Ht 63.0 in | Wt 194.0 lb

## 2024-01-20 DIAGNOSIS — M79645 Pain in left finger(s): Secondary | ICD-10-CM

## 2024-01-20 DIAGNOSIS — M25531 Pain in right wrist: Secondary | ICD-10-CM | POA: Diagnosis not present

## 2024-01-20 DIAGNOSIS — E876 Hypokalemia: Secondary | ICD-10-CM

## 2024-01-20 MED ORDER — PREDNISONE 20 MG PO TABS: 40.0000 mg | ORAL_TABLET | Freq: Every day | ORAL | 0 refills | Status: AC

## 2024-01-20 NOTE — Telephone Encounter (Signed)
 Appt made

## 2024-01-20 NOTE — Telephone Encounter (Signed)
  Chief Complaint: joint pain Symptoms: hands, hips, knees Frequency: any movement   Disposition: [] ED /[] Urgent Care (no appt availability in office) / [x] Appointment(In office/virtual)/ []  [facility]/ [] Home Care/ [] Refused Recommended Disposition /[] [facility]/ []  Follow-up with PCP Additional Notes: Pt complaining of hand, hip, and knee pain. Right wrist and thumb hurt the worst. Pt rated pain 8/10. Pt was unable to fasten bra this morning. Pt works on [employer] and now working has become very difficult. Pt stated she has carpal tunnel syndrome but hasn't had a flare up In years. Pt noticed the joint pain started after starting Lipitor in Jan at wellness visit.  Pt denies any fever. Per protocol, pt to be seen within 4 hours. Pt has appt today at 1550. RN gave care advice and pt verbalized understanding.                Reason for Disposition  [1] SEVERE pain (e.g., excruciating, unable to use hand at all) AND [2] not improved after 2 hours of pain medicine  Answer Assessment - Initial Assessment Questions 1. ONSET: "When did the pain start?"     Month ago 2. LOCATION: "Where is the pain located?"     Right wrist and left thumb 3. PAIN: "How bad is the pain?" (Scale 1-10; or mild, moderate, severe)   - MILD (1-3): doesn't interfere with normal activities   - MODERATE (4-7): interferes with normal activities (e.g., work or school) or awakens from sleep   - SEVERE (8-10): excruciating pain, unable to use hand at all     8 4. WORK OR EXERCISE: "Has there been any recent work or exercise that involved this part (i.e., hand or wrist) of the body?"     Yes, works at home on computer 5. CAUSE: "What do you think is causing the pain?"     Since starting Lipitor  6. AGGRAVATING FACTORS: "What makes the pain worse?" (e.g., using computer)     Any movement  7. OTHER SYMPTOMS: "Do you have any other symptoms?" (e.g., neck pain, swelling, rash, numbness, fever)      Hips and knees pain; numbness in both hands when using a lot  Protocols used: Hand and Wrist Pain-A-AH

## 2024-01-20 NOTE — Progress Notes (Signed)
 Subjective:  Patient ID: Kathryn Mitchell, female    DOB: 15-Feb-1971, 53 y.o.   MRN: [id number]  Patient Care Team: Junie Spencer, FNP as PCP - General (Family Medicine) [facility], [facility]   Chief Complaint:  joint pain, multiple sites (Left thumb, right wrist/Concern that atorvastatin might be the culprit)  HPI: Kathryn Mitchell is a 53 y.o. 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 States that she normally has pain after activity such as gardening or painting. States that it normally goes away after one week. States that since she started Lipitor pain has gotten worse and lasts longer. Today has joint pain in right wrist and left thumb. She states that she wears a wrist brace at night and during the day, but it is hard to complete tasks with brace on. Not currently taking anything OTC. Denies any trauma or injuries.   Relevant past medical, surgical, family, and social history reviewed and updated as indicated.  Allergies and medications reviewed and updated. Data reviewed: Chart in Epic.   Past Medical History:  Diagnosis Date   GAD (generalized anxiety disorder)    GERD (gastroesophageal reflux disease)    HLD (hyperlipidemia)    Hypertension    Panic attacks     Past Surgical History:  Procedure Laterality Date   Cyst removal lower back     TUBAL LIGATION      Social History   Socioeconomic History   Marital status: Married    Spouse name: Not on file   Number of children: Not on file   Years of education: Not on file   Highest education level: Not on file  Occupational History   Not on file  Tobacco Use   Smoking status: Former   Smokeless tobacco: Never  Vaping Use   Vaping status: Never Used  Substance and Sexual Activity   Alcohol use: Not on file    Drug use: Not on file   Sexual activity: Not on file  Other Topics Concern   Not on file  Social History Narrative   Not on file   Social Drivers of Health   Financial Resource Strain: Not on file  Food Insecurity: No Food Insecurity (08/13/2022)   Received from [facility], [facility]   Hunger Vital Sign    Worried About Running Out of Food in the Last Year: Never true    Ran Out of Food in the Last Year: Never true  Transportation Needs: Not on file  Physical Activity: Not on file  Stress: Not on file  Social Connections: Unknown (08/01/2022)   Received from [facility], [facility]   Social Network    Social Network: Not on file  Intimate Partner Violence: Unknown (08/01/2022)   Received from [facility], [facility]   HITS    Physically Hurt: Not on file    Insult or Talk Down To: Not on file    Threaten Physical Harm: Not on file    Scream or Curse: Not on file    Outpatient Encounter Medications as of 01/20/2024  Medication Sig   atorvastatin (LIPITOR) 20 MG tablet Take 1 tablet (20 mg total) by mouth daily.   busPIRone (BUSPAR) 5 MG tablet Take 1 tablet (5 mg total) by mouth 3 (three) times daily as needed.  fluticasone (FLONASE) 50 MCG/ACT nasal spray Place 2 sprays into both nostrils daily.   hydrochlorothiazide (HYDRODIURIL) 25 MG tablet TAKE 1 TABLET (25 MG TOTAL) BY MOUTH DAILY.   omeprazole (PRILOSEC) 20 MG capsule Take 1 capsule (20 mg total) by mouth daily.   PARoxetine (PAXIL) 40 MG tablet Take 0.5 tablets (20 mg total) by mouth daily.   potassium chloride SA (KLOR-CON M) 20 MEQ tablet TAKE 1 TABLET BY MOUTH TWICE A DAY   valACYclovir (VALTREX) 500 MG tablet Take 4 tablets (2,000 mg total) by mouth 2 (two) times daily.   No facility-administered encounter medications on file as of 01/20/2024.    Allergies  Allergen Reactions   Codeine     Review of Systems As per HPI  Objective:  BP 113/74   Pulse 70   Temp 98.7 F (37.1 C)   Ht 5\' 3"   (1.6 m)   Wt 194 lb (88 kg)   LMP 01/01/2017   SpO2 97%   BMI 34.37 kg/m    Wt Readings from Last 3 Encounters:  01/20/24 194 lb (88 kg)  12/04/23 192 lb (87.1 kg)  06/04/23 191 lb 9.6 oz (86.9 kg)    Physical Exam Constitutional:      General: She is awake. She is not in acute distress.    Appearance: Normal appearance. She is well-developed and well-groomed. She is obese. She is not ill-appearing, toxic-appearing or diaphoretic.  Cardiovascular:     Rate and Rhythm: Normal rate and regular rhythm.     Pulses: Normal pulses.          Radial pulses are 2+ on the right side and 2+ on the left side.       Posterior tibial pulses are 2+ on the right side and 2+ on the left side.     Heart sounds: Normal heart sounds. No murmur heard.    No gallop.  Pulmonary:     Effort: Pulmonary effort is normal. No respiratory distress.     Breath sounds: Normal breath sounds. No stridor. No wheezing, rhonchi or rales.  Musculoskeletal:     Right wrist: Swelling, effusion, tenderness and bony tenderness present. No deformity, lacerations, snuff box tenderness or crepitus. Decreased range of motion. Normal pulse.     Cervical back: Full passive range of motion without pain and neck supple.     Right lower leg: No edema.     Left lower leg: No edema.     Comments: Left thumb with swelling at base of thumb, decreased ROM and tenderness to palpation.   Skin:    General: Skin is warm.     Capillary Refill: Capillary refill takes less than 2 seconds.  Neurological:     General: No focal deficit present.     Mental Status: She is alert, oriented to person, place, and time and easily aroused. Mental status is at baseline.     GCS: GCS eye subscore is 4. GCS verbal subscore is 5. GCS motor subscore is 6.     Motor: No weakness.  Psychiatric:        Attention and Perception: Attention and perception normal.        Mood and Affect: Mood and affect normal.        Speech: Speech normal.         Behavior: Behavior normal. Behavior is cooperative.        Thought Content: Thought content normal. Thought content does not include homicidal or suicidal ideation. Thought content does  not include homicidal or suicidal plan.        Cognition and Memory: Cognition and memory normal.        Judgment: Judgment normal.     Results for orders placed or performed in visit on 12/04/23  CBC with Differential/Platelet   Collection Time: 12/04/23  8:30 AM  Result Value Ref Range   WBC 7.3 3.4 - 10.8 x10E3/uL   RBC 5.25 3.77 - 5.28 x10E6/uL   Hemoglobin 15.5 11.1 - 15.9 g/dL   Hematocrit 16.1 09.6 - 46.6 %   MCV 88 79 - 97 fL   MCH 29.5 26.6 - 33.0 pg   MCHC 33.4 31.5 - 35.7 g/dL   RDW 04.5 40.9 - 81.1 %   Platelets 262 150 - 450 x10E3/uL   Neutrophils 67 Not Estab. %   Lymphs 28 Not Estab. %   Monocytes 4 Not Estab. %   Eos 1 Not Estab. %   Basos 0 Not Estab. %   Neutrophils Absolute 4.8 1.4 - 7.0 x10E3/uL   Lymphocytes Absolute 2.0 0.7 - 3.1 x10E3/uL   Monocytes Absolute 0.3 0.1 - 0.9 x10E3/uL   EOS (ABSOLUTE) 0.1 0.0 - 0.4 x10E3/uL   Basophils Absolute 0.0 0.0 - 0.2 x10E3/uL   Immature Granulocytes 0 Not Estab. %   Immature Grans (Abs) 0.0 0.0 - 0.1 x10E3/uL  CMP14+EGFR   Collection Time: 12/04/23  8:30 AM  Result Value Ref Range   Glucose 104 (H) 70 - 99 mg/dL   BUN 9 6 - 24 mg/dL   Creatinine, Ser 9.14 0.57 - 1.00 mg/dL   eGFR 88 >78 GN/FAO/1.30   BUN/Creatinine Ratio 11 9 - 23   Sodium 140 134 - 144 mmol/L   Potassium 3.2 (L) 3.5 - 5.2 mmol/L   Chloride 97 96 - 106 mmol/L   CO2 29 20 - 29 mmol/L   Calcium 9.3 8.7 - 10.2 mg/dL   Total Protein 6.7 6.0 - 8.5 g/dL   Albumin 3.9 3.8 - 4.9 g/dL   Globulin, Total 2.8 1.5 - 4.5 g/dL   Bilirubin Total 0.5 0.0 - 1.2 mg/dL   Alkaline Phosphatase 119 44 - 121 IU/L   AST 18 0 - 40 IU/L   ALT 17 0 - 32 IU/L  Lipid panel   Collection Time: 12/04/23  8:30 AM  Result Value Ref Range   Cholesterol, Total 264 (H) 100 - 199 mg/dL    Triglycerides 865 (H) 0 - 149 mg/dL   HDL 52 >78 mg/dL   VLDL Cholesterol Cal 33 5 - 40 mg/dL   LDL Chol Calc (NIH) 469 (H) 0 - 99 mg/dL   Chol/HDL Ratio 5.1 (H) 0.0 - 4.4 ratio  TSH   Collection Time: 12/04/23  8:30 AM  Result Value Ref Range   TSH 1.700 0.450 - 4.500 uIU/mL       01/20/2024    4:17 PM 12/04/2023    8:06 AM 06/04/2023    8:14 AM 05/20/2022    8:48 AM 05/22/2020    2:52 PM  Depression screen PHQ 2/9  Decreased Interest 1 0 1 0 1  Down, Depressed, Hopeless 1 0 1 0 1  PHQ - 2 Score 2 0 2 0 2  Altered sleeping 1  0  0  Tired, decreased energy 1  3  1   Change in appetite 1  0  1  Feeling bad or failure about yourself  1  1  0  Trouble concentrating 2  3  1  Moving slowly or fidgety/restless 1  1  0  Suicidal thoughts 0  0  0  PHQ-9 Score 9  10  5   Difficult doing work/chores Not difficult at all  Somewhat difficult  Not difficult at all       01/20/2024    4:18 PM 06/04/2023    8:14 AM 05/22/2020    2:53 PM  GAD 7 : Generalized Anxiety Score  Nervous, Anxious, on Edge 0 1 1  Control/stop worrying 0 1 1  Worry too much - different things 0 1 1  Trouble relaxing 0 2 1  Restless 0 2 1  Easily annoyed or irritable 0 2 1  Afraid - awful might happen 0 0 1  Total GAD 7 Score 0 9 7  Anxiety Difficulty Not difficult at all Somewhat difficult Not difficult at all   Pertinent labs & imaging results that were available during my care of the patient were reviewed by me and considered in my medical decision making.  Assessment & Plan:  Cornell "Larita Fife" was seen today for joint pain, multiple sites.  Diagnoses and all orders for this visit:  Right wrist pain Will send in medication as below. Discussed at home care with bracing, heat/ice, tylenol and ibuprofen use. Reviewed CMP from 12/04/23, GFR WNL. Patient was hypokalemic at that time. Will repeat lab as below. If pain does not resolve, can follow up with PCP for further evaluation and possible rheumatology work up.  Patient to follow up with PCP to consider switch to zetia as she has tried lipitor and crestor.  -     predniSONE (DELTASONE) 20 MG tablet; Take 2 tablets (40 mg total) by mouth daily with breakfast for 5 days.  Pain of left thumb As above.  -     predniSONE (DELTASONE) 20 MG tablet; Take 2 tablets (40 mg total) by mouth daily with breakfast for 5 days.  Hypokalemia As above.  -     CMP14+EGFR  Continue all other maintenance medications.  Follow up plan: Return in about 2 weeks (around 02/03/2024) for WITH PCP.   Continue healthy lifestyle choices, including diet (rich in fruits, vegetables, and lean proteins, and low in salt and simple carbohydrates) and exercise (at least 30 minutes of moderate physical activity daily).  Written and verbal instructions provided   The above assessment and management plan was discussed with the patient. The patient verbalized understanding of and has agreed to the management plan. Patient is aware to call the clinic if they develop any new symptoms or if symptoms persist or worsen. Patient is aware when to return to the clinic for a follow-up visit. Patient educated on when it is appropriate to go to the emergency department.   Neale Burly, DNP-FNP [facility] [address] [place], [place] [address] [phone]

## 2024-01-21 ENCOUNTER — Encounter: Payer: Self-pay | Admitting: Family Medicine

## 2024-01-21 LAB — CMP14+EGFR
ALT: 14 [IU]/L (ref 0–32)
AST: 19 [IU]/L (ref 0–40)
Albumin: 3.8 g/dL (ref 3.8–4.9)
Alkaline Phosphatase: 123 [IU]/L — ABNORMAL HIGH (ref 44–121)
BUN/Creatinine Ratio: 12 (ref 9–23)
BUN: 8 mg/dL (ref 6–24)
Bilirubin Total: 0.3 mg/dL (ref 0.0–1.2)
CO2: 27 mmol/L (ref 20–29)
Calcium: 9.3 mg/dL (ref 8.7–10.2)
Chloride: 94 mmol/L — ABNORMAL LOW (ref 96–106)
Creatinine, Ser: 0.68 mg/dL (ref 0.57–1.00)
Globulin, Total: 2.7 g/dL (ref 1.5–4.5)
Glucose: 83 mg/dL (ref 70–99)
Potassium: 3.1 mmol/L — ABNORMAL LOW (ref 3.5–5.2)
Sodium: 138 mmol/L (ref 134–144)
Total Protein: 6.5 g/dL (ref 6.0–8.5)
eGFR: 104 mL/min/{1.73_m2} (ref >59)

## 2024-01-21 NOTE — Progress Notes (Signed)
 Remains hypokalemic. Recommend patient increase dose to 40 meq twice daily. Repeat CMP in 1 week. Monitor for any symptoms of muscle weakness, palpitations

## 2024-02-02 ENCOUNTER — Ambulatory Visit (INDEPENDENT_AMBULATORY_CARE_PROVIDER_SITE_OTHER): Payer: No Typology Code available for payment source | Admitting: Family

## 2024-02-02 ENCOUNTER — Encounter: Payer: Self-pay | Admitting: Family

## 2024-02-02 VITALS — BP 125/81 | HR 82 | Temp 97.0°F | Ht 63.0 in | Wt 194.4 lb

## 2024-02-02 DIAGNOSIS — M79641 Pain in right hand: Secondary | ICD-10-CM

## 2024-02-02 DIAGNOSIS — I10 Essential (primary) hypertension: Secondary | ICD-10-CM

## 2024-02-02 DIAGNOSIS — M79642 Pain in left hand: Secondary | ICD-10-CM | POA: Diagnosis not present

## 2024-02-02 DIAGNOSIS — E876 Hypokalemia: Secondary | ICD-10-CM | POA: Diagnosis not present

## 2024-02-02 MED ORDER — DICLOFENAC SODIUM 75 MG PO TBEC: 75.0000 mg | DELAYED_RELEASE_TABLET | Freq: Two times a day (BID) | ORAL | 2 refills | Status: DC

## 2024-02-02 NOTE — Progress Notes (Signed)
 Subjective:    Patient ID: Kathryn Mitchell, female    DOB: [birth year], 53 y.o.   MRN: [id number]  Chief Complaint  Patient presents with   Follow-up    2 week follow up for k+   Pt presents to the office today with hypokalemia. She was seen for a acute visit on two weeks ago and was potassium chloride 20 meq once a day. Reports this has caused nausea.   She has been taking hydrochlorothiazide 25 mg for BP. Her BP is at goal today.   Complaining of bilateral thumb and hand pain. Reports her left one is worse and can be a 10 out 10. Was given prednisone that helped.   Hypertension This is a chronic problem. The current episode started more than 1 year ago. The problem has been resolved since onset. The problem is controlled. Associated symptoms include malaise/fatigue and shortness of breath. Pertinent negatives include no peripheral edema. Past treatments include diuretics. The current treatment provides moderate improvement.      Review of Systems  Constitutional:  Positive for malaise/fatigue.  Respiratory:  Positive for shortness of breath.   All other systems reviewed and are negative.   Social History   Socioeconomic History   Marital status: Married    Spouse name: Not on file   Number of children: Not on file   Years of education: Not on file   Highest education level: Not on file  Occupational History   Not on file  Tobacco Use   Smoking status: Former   Smokeless tobacco: Never  Vaping Use   Vaping status: Never Used  Substance and Sexual Activity   Alcohol use: Not on file   Drug use: Not on file   Sexual activity: Not on file  Other Topics Concern   Not on file  Social History Narrative   Not on file   Social Drivers of Health   Financial Resource Strain: Not on file  Food Insecurity: No Food Insecurity (08/13/2022)   Received from [facility], [facility]   Hunger Vital Sign    Worried About Running Out of Food in the Last Year: Never true     Ran Out of Food in the Last Year: Never true  Transportation Needs: Not on file  Physical Activity: Not on file  Stress: Not on file  Social Connections: Unknown (08/01/2022)   Received from [facility], [facility]   Social Network    Social Network: Not on file   Family History  Problem Relation Age of Onset   Cancer Mother    Liver disease Father         Objective:   Physical Exam Vitals reviewed.  Constitutional:      General: She is not in acute distress.    Appearance: She is well-developed.  HENT:     Head: Normocephalic and atraumatic.     Right Ear: Tympanic membrane normal.     Left Ear: Tympanic membrane normal.  Eyes:     Pupils: Pupils are equal, round, and reactive to light.  Neck:     Thyroid: No thyromegaly.  Cardiovascular:     Rate and Rhythm: Normal rate and regular rhythm.     Heart sounds: Normal heart sounds. No murmur heard. Pulmonary:     Effort: Pulmonary effort is normal. No respiratory distress.     Breath sounds: Normal breath sounds. No wheezing.  Abdominal:     General: Bowel sounds are normal. There is no distension.  Palpations: Abdomen is soft.     Tenderness: There is no abdominal tenderness.  Musculoskeletal:        General: No tenderness. Normal range of motion.     Cervical back: Normal range of motion and neck supple.  Skin:    General: Skin is warm and dry.  Neurological:     Mental Status: She is alert and oriented to person, place, and time.     Cranial Nerves: No cranial nerve deficit.     Deep Tendon Reflexes: Reflexes are normal and symmetric.  Psychiatric:        Behavior: Behavior normal.        Thought Content: Thought content normal.        Judgment: Judgment normal.       BP 125/81   Pulse 82   Temp (!) 97 F (36.1 C) (Temporal)   Ht 5\' 3"  (1.6 m)   Wt 194 lb 6.4 oz (88.2 kg)   LMP 01/01/2017   SpO2 95%   BMI 34.44 kg/m      Assessment & Plan:  Kathryn Mitchell comes in today with chief  complaint of Follow-up (2 week follow up for k+)   Diagnosis and orders addressed:  1. Hypokalemia (Primary) - BMP8+EGFR  2. Essential hypertension, benign - BMP8+EGFR  3. Pain in both hands - Rheumatoid factor - diclofenac (VOLTAREN) 75 MG EC tablet; Take 1 tablet (75 mg total) by mouth 2 (two) times daily.  Dispense: 60 tablet; Refill: 2   Labs pending, if K+ is low will stop hydrochlorothiazide and start lisinopril  Potassium rich diet discussed, handout given Continue current medications  Start diclofenac BID with food, no other NSAID's Keep chronic follow up    Kathryn Rodney, FNP

## 2024-02-02 NOTE — Patient Instructions (Signed)
 Hypokalemia Hypokalemia means that the amount of potassium in the blood is lower than normal. Potassium is a mineral (electrolyte) that helps regulate the amount of fluid in the body. It also stimulates muscle tightening (contraction) and helps nerves work properly. Normally, most of the body's potassium is inside cells, and only a very small amount is in the blood. Because the amount in the blood is so small, minor changes to potassium levels in the blood can be life-threatening. What are the causes? This condition may be caused by: Antibiotic medicine. Diarrhea or vomiting. Taking too much of a medicine that helps you have a bowel movement (laxative) can cause diarrhea and lead to hypokalemia. Chronic kidney disease (CKD). Medicines that help the body get rid of excess fluid (diuretics). Eating disorders, such as anorexia or bulimia. Low magnesium levels in the body. Sweating a lot. What are the signs or symptoms? Symptoms of this condition include: Weakness. Constipation. Fatigue. Muscle cramps. Mental confusion. Skipped heartbeats or irregular heartbeat (palpitations). Tingling or numbness. How is this diagnosed? This condition is diagnosed with a blood test. How is this treated? This condition may be treated by: Taking potassium supplements. Adjusting the medicines that you take. Eating more foods that contain a lot of potassium. If your potassium level is very low, you may need to get potassium through an IV and be monitored in the hospital. Follow these instructions at home: Eating and drinking  Eat a healthy diet. A healthy diet includes fresh fruits and vegetables, whole grains, healthy fats, and lean proteins. If told, eat more foods that contain a lot of potassium. These include: Nuts, such as peanuts and pistachios. Seeds, such as sunflower seeds and pumpkin seeds. Peas, lentils, and lima beans. Whole grain and bran cereals and breads. Fresh fruits and vegetables,  such as apricots, avocado, bananas, cantaloupe, kiwi, oranges, tomatoes, asparagus, and potatoes. Juices, such as orange, tomato, and prune. Lean meats, including fish. Milk and milk products, such as yogurt. General instructions Take over-the-counter and prescription medicines only as told by your health care provider. This includes vitamins, natural food products, and supplements. Keep all follow-up visits. This is important. Contact a health care provider if: You have weakness that gets worse. You feel your heart pounding or racing. You vomit. You have diarrhea. You have diabetes and you have trouble keeping your blood sugar in your target range. Get help right away if: You have chest pain. You have shortness of breath. You have vomiting or diarrhea that lasts for more than 2 days. You faint. These symptoms may be an emergency. Get help right away. Call 911. Do not wait to see if the symptoms will go away. Do not drive yourself to the hospital. Summary Hypokalemia means that the amount of potassium in the blood is lower than normal. This condition is diagnosed with a blood test. Hypokalemia may be treated by taking potassium supplements, adjusting the medicines that you take, or eating more foods that are high in potassium. If your potassium level is very low, you may need to get potassium through an IV and be monitored in the hospital. This information is not intended to replace advice given to you by your health care provider. Make sure you discuss any questions you have with your health care provider. Document Revised: 07/25/2021 Document Reviewed: 07/25/2021 Elsevier Patient Education  2024 [employer].

## 2024-02-03 LAB — RHEUMATOID FACTOR: Rheumatoid fact SerPl-aCnc: 11.6 [IU]/mL (ref <14.0)

## 2024-02-03 LAB — BMP8+EGFR
BUN/Creatinine Ratio: 8 — ABNORMAL LOW (ref 9–23)
BUN: 8 mg/dL (ref 6–24)
CO2: 23 mmol/L (ref 20–29)
Calcium: 9.1 mg/dL (ref 8.7–10.2)
Chloride: 98 mmol/L (ref 96–106)
Creatinine, Ser: 0.98 mg/dL (ref 0.57–1.00)
Glucose: 154 mg/dL — ABNORMAL HIGH (ref 70–99)
Potassium: 3.4 mmol/L — ABNORMAL LOW (ref 3.5–5.2)
Sodium: 139 mmol/L (ref 134–144)
eGFR: 69 mL/min/{1.73_m2} (ref >59)

## 2024-02-04 ENCOUNTER — Other Ambulatory Visit: Payer: Self-pay | Admitting: Family

## 2024-02-04 MED ORDER — LISINOPRIL 20 MG PO TABS
20.0000 mg | ORAL_TABLET | Freq: Every day | ORAL | 3 refills | Status: DC
Start: 2024-02-04 — End: 2024-03-11

## 2024-02-29 ENCOUNTER — Encounter: Payer: Self-pay | Admitting: Family

## 2024-02-29 ENCOUNTER — Ambulatory Visit (INDEPENDENT_AMBULATORY_CARE_PROVIDER_SITE_OTHER): Admitting: Family

## 2024-02-29 VITALS — BP 125/82 | HR 74 | Temp 98.0°F | Ht 63.0 in | Wt 198.0 lb

## 2024-02-29 DIAGNOSIS — E876 Hypokalemia: Secondary | ICD-10-CM

## 2024-02-29 DIAGNOSIS — I10 Essential (primary) hypertension: Secondary | ICD-10-CM | POA: Diagnosis not present

## 2024-02-29 NOTE — Progress Notes (Signed)
 Subjective:    Patient ID: Kathryn Mitchell, female    DOB: [birth year], 53 y.o.   MRN: [id number]  Chief Complaint  Patient presents with   Follow-up    4 week k+ rck    PT presents to the office today to recheck HTN and K+. We stopped her hydrochlorothiazide and started lisinopril 20 mg. Her BP is at goal today.   Her last potassium was 3.4. She stopped her oral K+.  Hypertension This is a chronic problem. The current episode started more than 1 year ago. The problem has been resolved since onset. The problem is controlled. Associated symptoms include malaise/fatigue. Pertinent negatives include no peripheral edema or shortness of breath. Risk factors for coronary artery disease include sedentary lifestyle. Past treatments include ACE inhibitors. The current treatment provides moderate improvement.      Review of Systems  Constitutional:  Positive for malaise/fatigue.  Respiratory:  Negative for shortness of breath.   All other systems reviewed and are negative.   Social History   Socioeconomic History   Marital status: Married    Spouse name: Not on file   Number of children: Not on file   Years of education: Not on file   Highest education level: Not on file  Occupational History   Not on file  Tobacco Use   Smoking status: Former   Smokeless tobacco: Never  Vaping Use   Vaping status: Never Used  Substance and Sexual Activity   Alcohol use: Not on file   Drug use: Not on file   Sexual activity: Not on file  Other Topics Concern   Not on file  Social History Narrative   Not on file   Social Drivers of Health   Financial Resource Strain: Not on file  Food Insecurity: No Food Insecurity (08/13/2022)   Received from [facility], [facility]   Hunger Vital Sign    Worried About Running Out of Food in the Last Year: Never true    Ran Out of Food in the Last Year: Never true  Transportation Needs: Not on file  Physical Activity: Not on file  Stress: Not on  file  Social Connections: Unknown (08/01/2022)   Received from [facility], [facility]   Social Network    Social Network: Not on file   Family History  Problem Relation Age of Onset   Cancer Mother    Liver disease Father         Objective:   Physical Exam Vitals reviewed.  Constitutional:      General: She is not in acute distress.    Appearance: She is well-developed.  HENT:     Head: Normocephalic and atraumatic.     Right Ear: Tympanic membrane normal.     Left Ear: Tympanic membrane normal.  Eyes:     Pupils: Pupils are equal, round, and reactive to light.  Neck:     Thyroid: No thyromegaly.  Cardiovascular:     Rate and Rhythm: Normal rate and regular rhythm.     Heart sounds: Normal heart sounds. No murmur heard. Pulmonary:     Effort: Pulmonary effort is normal. No respiratory distress.     Breath sounds: Normal breath sounds. No wheezing.  Abdominal:     General: Bowel sounds are normal. There is no distension.     Palpations: Abdomen is soft.     Tenderness: There is no abdominal tenderness.  Musculoskeletal:        General: No tenderness. Normal  range of motion.     Cervical back: Normal range of motion and neck supple.  Skin:    General: Skin is warm and dry.  Neurological:     Mental Status: She is alert and oriented to person, place, and time.     Cranial Nerves: No cranial nerve deficit.     Deep Tendon Reflexes: Reflexes are normal and symmetric.  Psychiatric:        Behavior: Behavior normal.        Thought Content: Thought content normal.        Judgment: Judgment normal.       BP 125/82   Pulse 74   Temp 98 F (36.7 C) (Temporal)   Ht 5\' 3"  (1.6 m)   Wt 198 lb (89.8 kg)   LMP 01/01/2017   SpO2 92%   BMI 35.07 kg/m      Assessment & Plan:  Kathryn Mitchell comes in today with chief complaint of Follow-up (4 week k+ rck )   Diagnosis and orders addressed:  1. Essential hypertension, benign (Primary) Continue Lisinopril   -Daily blood pressure log given with instructions on how to fill out and told to bring to next visit -Dash diet information given -Exercise encouraged - Stress Management  -Continue current meds - BMP8+EGFR  2. Hypokalemia High K+ - BMP8+EGFR      Jannifer Rodney, FNP

## 2024-02-29 NOTE — Patient Instructions (Signed)
 Hypokalemia Hypokalemia means that the amount of potassium in the blood is lower than normal. Potassium is a mineral (electrolyte) that helps regulate the amount of fluid in the body. It also stimulates muscle tightening (contraction) and helps nerves work properly. Normally, most of the body's potassium is inside cells, and only a very small amount is in the blood. Because the amount in the blood is so small, minor changes to potassium levels in the blood can be life-threatening. What are the causes? This condition may be caused by: Antibiotic medicine. Diarrhea or vomiting. Taking too much of a medicine that helps you have a bowel movement (laxative) can cause diarrhea and lead to hypokalemia. Chronic kidney disease (CKD). Medicines that help the body get rid of excess fluid (diuretics). Eating disorders, such as anorexia or bulimia. Low magnesium levels in the body. Sweating a lot. What are the signs or symptoms? Symptoms of this condition include: Weakness. Constipation. Fatigue. Muscle cramps. Mental confusion. Skipped heartbeats or irregular heartbeat (palpitations). Tingling or numbness. How is this diagnosed? This condition is diagnosed with a blood test. How is this treated? This condition may be treated by: Taking potassium supplements. Adjusting the medicines that you take. Eating more foods that contain a lot of potassium. If your potassium level is very low, you may need to get potassium through an IV and be monitored in the hospital. Follow these instructions at home: Eating and drinking  Eat a healthy diet. A healthy diet includes fresh fruits and vegetables, whole grains, healthy fats, and lean proteins. If told, eat more foods that contain a lot of potassium. These include: Nuts, such as peanuts and pistachios. Seeds, such as sunflower seeds and pumpkin seeds. Peas, lentils, and lima beans. Whole grain and bran cereals and breads. Fresh fruits and vegetables,  such as apricots, avocado, bananas, cantaloupe, kiwi, oranges, tomatoes, asparagus, and potatoes. Juices, such as orange, tomato, and prune. Lean meats, including fish. Milk and milk products, such as yogurt. General instructions Take over-the-counter and prescription medicines only as told by your health care provider. This includes vitamins, natural food products, and supplements. Keep all follow-up visits. This is important. Contact a health care provider if: You have weakness that gets worse. You feel your heart pounding or racing. You vomit. You have diarrhea. You have diabetes and you have trouble keeping your blood sugar in your target range. Get help right away if: You have chest pain. You have shortness of breath. You have vomiting or diarrhea that lasts for more than 2 days. You faint. These symptoms may be an emergency. Get help right away. Call 911. Do not wait to see if the symptoms will go away. Do not drive yourself to the hospital. Summary Hypokalemia means that the amount of potassium in the blood is lower than normal. This condition is diagnosed with a blood test. Hypokalemia may be treated by taking potassium supplements, adjusting the medicines that you take, or eating more foods that are high in potassium. If your potassium level is very low, you may need to get potassium through an IV and be monitored in the hospital. This information is not intended to replace advice given to you by your health care provider. Make sure you discuss any questions you have with your health care provider. Document Revised: 07/25/2021 Document Reviewed: 07/25/2021 Elsevier Patient Education  2024 [employer].

## 2024-03-01 ENCOUNTER — Other Ambulatory Visit: Payer: Self-pay | Admitting: Family

## 2024-03-01 ENCOUNTER — Telehealth: Payer: Self-pay

## 2024-03-01 LAB — BMP8+EGFR
BUN/Creatinine Ratio: 7 — ABNORMAL LOW (ref 9–23)
BUN: 6 mg/dL (ref 6–24)
CO2: 21 mmol/L (ref 20–29)
Calcium: 8.5 mg/dL — ABNORMAL LOW (ref 8.7–10.2)
Chloride: 104 mmol/L (ref 96–106)
Creatinine, Ser: 0.82 mg/dL (ref 0.57–1.00)
Glucose: 172 mg/dL — ABNORMAL HIGH (ref 70–99)
Potassium: 3.5 mmol/L (ref 3.5–5.2)
Sodium: 144 mmol/L (ref 134–144)
eGFR: 85 mL/min/{1.73_m2} (ref >59)

## 2024-03-01 NOTE — Telephone Encounter (Signed)
 Patient aware, verbalized understanding. [facility] 03/01/24

## 2024-03-05 ENCOUNTER — Other Ambulatory Visit: Payer: Self-pay | Admitting: Family

## 2024-03-05 DIAGNOSIS — I10 Essential (primary) hypertension: Secondary | ICD-10-CM

## 2024-03-10 ENCOUNTER — Other Ambulatory Visit: Payer: Self-pay

## 2024-03-11 ENCOUNTER — Other Ambulatory Visit: Payer: Self-pay | Admitting: Family

## 2024-03-11 DIAGNOSIS — F410 Panic disorder [episodic paroxysmal anxiety] without agoraphobia: Secondary | ICD-10-CM

## 2024-03-11 DIAGNOSIS — E7800 Pure hypercholesterolemia, unspecified: Secondary | ICD-10-CM

## 2024-03-11 DIAGNOSIS — F411 Generalized anxiety disorder: Secondary | ICD-10-CM

## 2024-03-11 DIAGNOSIS — K219 Gastro-esophageal reflux disease without esophagitis: Secondary | ICD-10-CM

## 2024-03-11 NOTE — Telephone Encounter (Signed)
 Copied from CRM [id number]. Topic: Clinical - Medication Refill >> Mar 11, 2024  1:42 PM Carlatta H wrote: Most Recent Primary Care Visit:   Medication: atorvastatin  (LIPITOR) 20 MG tablet [[id number]] lisinopril  (ZESTRIL ) 20 MG tablet [[id number]] fluticasone  (FLONASE ) 50 MCG/ACT nasal spray [[id number]] PARoxetine  (PAXIL ) 40 MG tablet [[id number]] omeprazole  (PRILOSEC) 20 MG capsule [[id number]]    Has the patient contacted their pharmacy? No (Agent: If no, request that the patient contact the pharmacy for the refill. If patient does not wish to contact the pharmacy document the reason why and proceed with request.) (Agent: If yes, when and what did the pharmacy advise?)  Is this the correct pharmacy for this prescription? Yes If no, delete pharmacy and type the correct one.  This is the patient's preferred pharmacy:  5  [facility] [phone] - [place], [place] - [address] AT [facility] [address] [place] [address] Phone: [phone] Fax: [phone]   Has the prescription been filled recently? No  Is the patient out of the medication? Yes  Has the patient been seen for an appointment in the last year OR does the patient have an upcoming appointment? Yes  Can we respond through MyChart? No  Agent: Please be advised that Rx refills may take up to 3 business days. We ask that you follow-up with your pharmacy.

## 2024-03-14 ENCOUNTER — Other Ambulatory Visit: Payer: Self-pay | Admitting: Family

## 2024-03-14 MED ORDER — ATORVASTATIN CALCIUM 20 MG PO TABS: 20.0000 mg | ORAL_TABLET | Freq: Every day | ORAL | 3 refills | Status: DC

## 2024-03-14 MED ORDER — OMEPRAZOLE 20 MG PO CPDR: 20.0000 mg | DELAYED_RELEASE_CAPSULE | Freq: Every day | ORAL | 1 refills | Status: DC

## 2024-03-14 MED ORDER — FLUTICASONE PROPIONATE 50 MCG/ACT NA SUSP: 2.0000 | Freq: Every day | NASAL | 6 refills | Status: AC

## 2024-03-14 MED ORDER — LISINOPRIL 20 MG PO TABS: 20.0000 mg | ORAL_TABLET | Freq: Every day | ORAL | 3 refills | Status: DC

## 2024-03-14 MED ORDER — PAROXETINE HCL 40 MG PO TABS: 20.0000 mg | ORAL_TABLET | Freq: Every day | ORAL | 1 refills | Status: DC

## 2024-03-14 MED ORDER — VALACYCLOVIR HCL 500 MG PO TABS: 2000.0000 mg | ORAL_TABLET | Freq: Two times a day (BID) | ORAL | 1 refills | Status: DC

## 2024-04-04 ENCOUNTER — Telehealth: Payer: Self-pay | Admitting: *Deleted

## 2024-04-04 NOTE — Telephone Encounter (Signed)
 From [facility] RE: Valcyclovir 500 mg take 4 tabs po BID qty #4 Please clarify Qty and sig

## 2024-04-05 MED ORDER — VALACYCLOVIR HCL 1 G PO TABS: 2000.0000 mg | ORAL_TABLET | Freq: Two times a day (BID) | ORAL | 2 refills | Status: AC

## 2024-04-05 NOTE — Telephone Encounter (Signed)
 New Prescription sent to pharmacy

## 2024-04-05 NOTE — Addendum Note (Signed)
 Addended by: Tommas Fragmin A on: 04/05/2024 01:16 PM   Modules accepted: Orders

## 2024-04-06 ENCOUNTER — Other Ambulatory Visit: Payer: Self-pay | Admitting: Family

## 2024-04-07 ENCOUNTER — Other Ambulatory Visit: Payer: Self-pay | Admitting: Family

## 2024-04-07 DIAGNOSIS — F410 Panic disorder [episodic paroxysmal anxiety] without agoraphobia: Secondary | ICD-10-CM

## 2024-04-07 DIAGNOSIS — F411 Generalized anxiety disorder: Secondary | ICD-10-CM

## 2024-04-07 DIAGNOSIS — K219 Gastro-esophageal reflux disease without esophagitis: Secondary | ICD-10-CM

## 2024-04-07 NOTE — Telephone Encounter (Signed)
 Name from pharmacy: OMEPRAZOLE  DR 20 MG CAPSULE  Pharmacy comment: Alternative Requested:NOT COVERED BY NEW INSURANCE.   Name from pharmacy: PAROXETINE  HCL 40 MG TABLET   Pharmacy comment: Alternative Requested:NOT COVERED BY INSURANCE

## 2024-04-28 ENCOUNTER — Telehealth: Payer: Self-pay | Admitting: Family

## 2024-04-28 NOTE — Telephone Encounter (Signed)
 Pt has not had any prescriptions since [facility], I informed her that [facility] of [facility] profile

## 2024-05-04 ENCOUNTER — Other Ambulatory Visit: Payer: Self-pay | Admitting: Family

## 2024-05-04 DIAGNOSIS — F410 Panic disorder [episodic paroxysmal anxiety] without agoraphobia: Secondary | ICD-10-CM

## 2024-05-04 DIAGNOSIS — K219 Gastro-esophageal reflux disease without esophagitis: Secondary | ICD-10-CM

## 2024-05-04 DIAGNOSIS — F411 Generalized anxiety disorder: Secondary | ICD-10-CM

## 2024-05-05 ENCOUNTER — Ambulatory Visit: Admitting: Family

## 2024-06-03 ENCOUNTER — Other Ambulatory Visit: Payer: Self-pay | Admitting: Family

## 2024-06-03 DIAGNOSIS — E7800 Pure hypercholesterolemia, unspecified: Secondary | ICD-10-CM

## 2024-06-03 MED ORDER — LISINOPRIL 20 MG PO TABS: 20.0000 mg | ORAL_TABLET | Freq: Every day | ORAL | 0 refills | Status: DC

## 2024-06-03 MED ORDER — ATORVASTATIN CALCIUM 20 MG PO TABS: 20.0000 mg | ORAL_TABLET | Freq: Every day | ORAL | 0 refills | Status: DC

## 2024-06-03 NOTE — Telephone Encounter (Signed)
 Copied from CRM [id number]. Topic: Clinical - Medication Refill >> Jun 03, 2024  2:17 PM Selinda RAMAN wrote: Medication: atorvastatin  (LIPITOR) 20 MG tablet, lisinopril  (ZESTRIL ) 20 MG tablet  Has the patient contacted their pharmacy? No   This is the patient's preferred pharmacy:  [facility] [phone] - [place], [place] - [address] AT [facility] [address] [place] [address] Phone: [phone] Fax: [phone]  Is this the correct pharmacy for this prescription? Yes If no, delete pharmacy and type the correct one.   Has the prescription been filled recently? No  Is the patient out of the medication? No but she only has a few pills left  Has the patient been seen for an appointment in the last year OR does the patient have an upcoming appointment? Yes  Can we respond through MyChart? Yes  Please assist patient further

## 2024-06-08 ENCOUNTER — Encounter (INDEPENDENT_AMBULATORY_CARE_PROVIDER_SITE_OTHER): Payer: Self-pay | Admitting: *Deleted

## 2024-07-20 LAB — HM MAMMOGRAPHY

## 2024-08-13 ENCOUNTER — Other Ambulatory Visit: Payer: Self-pay | Admitting: Family

## 2024-08-13 DIAGNOSIS — F411 Generalized anxiety disorder: Secondary | ICD-10-CM

## 2024-08-13 DIAGNOSIS — F410 Panic disorder [episodic paroxysmal anxiety] without agoraphobia: Secondary | ICD-10-CM

## 2024-08-13 DIAGNOSIS — K219 Gastro-esophageal reflux disease without esophagitis: Secondary | ICD-10-CM

## 2024-08-23 ENCOUNTER — Encounter: Payer: Self-pay | Admitting: Family

## 2024-08-23 ENCOUNTER — Ambulatory Visit (INDEPENDENT_AMBULATORY_CARE_PROVIDER_SITE_OTHER): Admitting: Family

## 2024-08-23 VITALS — BP 125/84 | HR 76 | Temp 97.5°F | Ht 63.0 in | Wt 204.4 lb

## 2024-08-23 DIAGNOSIS — E7800 Pure hypercholesterolemia, unspecified: Secondary | ICD-10-CM | POA: Diagnosis not present

## 2024-08-23 DIAGNOSIS — M79642 Pain in left hand: Secondary | ICD-10-CM

## 2024-08-23 DIAGNOSIS — F411 Generalized anxiety disorder: Secondary | ICD-10-CM

## 2024-08-23 DIAGNOSIS — K219 Gastro-esophageal reflux disease without esophagitis: Secondary | ICD-10-CM

## 2024-08-23 DIAGNOSIS — F17200 Nicotine dependence, unspecified, uncomplicated: Secondary | ICD-10-CM

## 2024-08-23 DIAGNOSIS — I10 Essential (primary) hypertension: Secondary | ICD-10-CM | POA: Diagnosis not present

## 2024-08-23 DIAGNOSIS — B002 Herpesviral gingivostomatitis and pharyngotonsillitis: Secondary | ICD-10-CM

## 2024-08-23 DIAGNOSIS — M79641 Pain in right hand: Secondary | ICD-10-CM

## 2024-08-23 DIAGNOSIS — F410 Panic disorder [episodic paroxysmal anxiety] without agoraphobia: Secondary | ICD-10-CM | POA: Diagnosis not present

## 2024-08-23 MED ORDER — OMEPRAZOLE 20 MG PO CPDR: 20.0000 mg | DELAYED_RELEASE_CAPSULE | Freq: Every day | ORAL | 4 refills | Status: AC

## 2024-08-23 MED ORDER — BUSPIRONE HCL 5 MG PO TABS
5.0000 mg | ORAL_TABLET | Freq: Three times a day (TID) | ORAL | 2 refills | Status: AC | PRN
Start: 2024-08-23 — End: ?

## 2024-08-23 MED ORDER — VALACYCLOVIR HCL 1 G PO TABS: 2000.0000 mg | ORAL_TABLET | Freq: Two times a day (BID) | ORAL | 1 refills | Status: AC

## 2024-08-23 MED ORDER — LISINOPRIL 20 MG PO TABS: 20.0000 mg | ORAL_TABLET | Freq: Every day | ORAL | 4 refills | Status: AC

## 2024-08-23 MED ORDER — DICLOFENAC SODIUM 75 MG PO TBEC: 75.0000 mg | DELAYED_RELEASE_TABLET | Freq: Two times a day (BID) | ORAL | 2 refills | Status: AC

## 2024-08-23 MED ORDER — ATORVASTATIN CALCIUM 20 MG PO TABS: 20.0000 mg | ORAL_TABLET | Freq: Every day | ORAL | 2 refills | Status: AC

## 2024-08-23 MED ORDER — PAROXETINE HCL 40 MG PO TABS: ORAL_TABLET | ORAL | 4 refills | Status: AC

## 2024-08-23 NOTE — Progress Notes (Signed)
 Subjective:    Patient ID: Kathryn Mitchell, female    DOB: 1971/03/24, 53 y.o.   MRN: [id number]  Chief Complaint  Patient presents with   Medical Management of Chronic Issues   PT presents to the office today for chronic follow up.  She is followed by GYN annually and her pap and mammogram is up to date.   She reports her husband is addicted to Meth and this is a huge stressor, but is doing better at this time.  She had her first grandbaby girl in May.    She is morbid  obese with a BMI of 36 with HTN and Hyperlipidemia.      08/23/2024    8:11 AM 02/29/2024    8:12 AM 02/02/2024    8:01 AM  Last 3 Weights  Weight (lbs) 204 lb 6.4 oz 198 lb 194 lb 6.4 oz  Weight (kg) 92.715 kg 89.812 kg 88.179 kg     Hypertension This is a chronic problem. The current episode started more than 1 year ago. The problem has been resolved since onset. The problem is controlled. Associated symptoms include anxiety and malaise/fatigue. Pertinent negatives include no peripheral edema or shortness of breath. Risk factors for coronary artery disease include obesity, sedentary lifestyle, smoking/tobacco exposure and dyslipidemia. The current treatment provides moderate improvement.  Gastroesophageal Reflux She complains of belching and heartburn. This is a chronic problem. The current episode started more than 1 year ago. The problem occurs rarely. Risk factors include obesity. She has tried a PPI for the symptoms. The treatment provided moderate relief.  Hyperlipidemia This is a chronic problem. The current episode started more than 1 year ago. The problem is uncontrolled. Recent lipid tests were reviewed and are high. Exacerbating diseases include obesity. Pertinent negatives include no shortness of breath. Current antihyperlipidemic treatment includes statins. The current treatment provides mild improvement of lipids. Risk factors for coronary artery disease include dyslipidemia, hypertension, a sedentary  lifestyle and obesity.  Anxiety Presents for follow-up visit. Symptoms include excessive worry and nervous/anxious behavior. Patient reports no shortness of breath. Symptoms occur occasionally. The severity of symptoms is mild.    Nicotine Dependence Presents for follow-up visit. The symptoms have been stable. She smokes 1 pack of cigarettes per day.     Review of Systems  Constitutional:  Positive for malaise/fatigue.  Respiratory:  Negative for shortness of breath.   Gastrointestinal:  Positive for heartburn.  Psychiatric/Behavioral:  The patient is nervous/anxious.   All other systems reviewed and are negative.  Family History  Problem Relation Age of Onset   Cancer Mother    Liver disease Father    Social History   Socioeconomic History   Marital status: Married    Spouse name: Not on file   Number of children: Not on file   Years of education: Not on file   Highest education level: Not on file  Occupational History   Not on file  Tobacco Use   Smoking status: Former   Smokeless tobacco: Never  Vaping Use   Vaping status: Never Used  Substance and Sexual Activity   Alcohol use: Not on file   Drug use: Not on file   Sexual activity: Not on file  Other Topics Concern   Not on file  Social History Narrative   Not on file   Social Drivers of Health   Financial Resource Strain: Not on file  Food Insecurity: No Food Insecurity (08/13/2022)   Received from [facility]  Hunger Vital Sign    Within the past 12 months, you worried that your food would run out before you got the money to buy more.: Never true    Within the past 12 months, the food you bought just didn't last and you didn't have money to get more.: Never true  Transportation Needs: Not on file  Physical Activity: Not on file  Stress: Not on file  Social Connections: Unknown (08/01/2022)   Received from [facility]   Social Network    Social Network: Not on file       Objective:   Physical  Exam Vitals reviewed.  Constitutional:      General: She is not in acute distress.    Appearance: She is well-developed. She is obese.  HENT:     Head: Normocephalic and atraumatic.     Right Ear: Tympanic membrane normal.     Left Ear: Tympanic membrane normal.  Eyes:     Pupils: Pupils are equal, round, and reactive to light.  Neck:     Thyroid : No thyromegaly.  Cardiovascular:     Rate and Rhythm: Normal rate and regular rhythm.     Heart sounds: Normal heart sounds. No murmur heard. Pulmonary:     Effort: Pulmonary effort is normal. No respiratory distress.     Breath sounds: Normal breath sounds. No wheezing.  Abdominal:     General: Bowel sounds are normal. There is no distension.     Palpations: Abdomen is soft.     Tenderness: There is no abdominal tenderness.  Musculoskeletal:        General: No tenderness. Normal range of motion.     Cervical back: Normal range of motion and neck supple.  Skin:    General: Skin is warm and dry.  Neurological:     Mental Status: She is alert and oriented to person, place, and time.     Cranial Nerves: No cranial nerve deficit.     Deep Tendon Reflexes: Reflexes are normal and symmetric.  Psychiatric:        Behavior: Behavior normal.        Thought Content: Thought content normal.        Judgment: Judgment normal.      BP 125/84   Pulse 76   Temp (!) 97.5 F (36.4 C) (Temporal)   Ht 5' 3 (1.6 m)   Wt 204 lb 6.4 oz (92.7 kg)   LMP 01/01/2017   BMI 36.21 kg/m      Assessment & Plan:  Kathryn Mitchell comes in today with chief complaint of Medical Management of Chronic Issues   Diagnosis and orders addressed:  1. Pure hypercholesterolemia - atorvastatin  (LIPITOR) 20 MG tablet; Take 1 tablet (20 mg total) by mouth daily.  Dispense: 90 tablet; Refill: 2 - CMP14+EGFR - Lipid panel  2. Panic attacks - busPIRone  (BUSPAR ) 5 MG tablet; Take 1 tablet (5 mg total) by mouth 3 (three) times daily as needed.  Dispense: 270  tablet; Refill: 2 - PARoxetine  (PAXIL ) 40 MG tablet; TAKE 1 TABLET(40 MG) BY MOUTH IN THE MORNING  Dispense: 90 tablet; Refill: 4 - CMP14+EGFR  3. GAD (generalized anxiety disorder) - busPIRone  (BUSPAR ) 5 MG tablet; Take 1 tablet (5 mg total) by mouth 3 (three) times daily as needed.  Dispense: 270 tablet; Refill: 2 - PARoxetine  (PAXIL ) 40 MG tablet; TAKE 1 TABLET(40 MG) BY MOUTH IN THE MORNING  Dispense: 90 tablet; Refill: 4 - CMP14+EGFR  4. Pain in both  hands - diclofenac  (VOLTAREN ) 75 MG EC tablet; Take 1 tablet (75 mg total) by mouth 2 (two) times daily.  Dispense: 60 tablet; Refill: 2 - CMP14+EGFR  5. Gastroesophageal reflux disease, unspecified whether esophagitis present - omeprazole  (PRILOSEC) 20 MG capsule; Take 1 capsule (20 mg total) by mouth daily.  Dispense: 90 capsule; Refill: 4 - CMP14+EGFR  6. Current smoker - CMP14+EGFR  7. Essential hypertension, benign (Primary) - lisinopril  (ZESTRIL ) 20 MG tablet; Take 1 tablet (20 mg total) by mouth daily.  Dispense: 90 tablet; Refill: 4 - CMP14+EGFR  8. Oral herpes - valACYclovir  (VALTREX ) 1000 MG tablet; Take 2 tablets (2,000 mg total) by mouth 2 (two) times daily for 2 days.  Dispense: 4 tablet; Refill: 1 - CMP14+EGFR   Labs pending Continue current medications  Keep specialists appointment  Health Maintenance reviewed Diet and exercise encouraged  Follow up plan: 6 months    Bari Learn, FNP

## 2024-08-23 NOTE — Patient Instructions (Signed)

## 2024-08-24 LAB — CMP14+EGFR
ALT: 13 IU/L (ref 0–32)
AST: 15 IU/L (ref 0–40)
Albumin: 3.5 g/dL — ABNORMAL LOW (ref 3.8–4.9)
Alkaline Phosphatase: 137 IU/L — ABNORMAL HIGH (ref 49–135)
BUN/Creatinine Ratio: 9 (ref 9–23)
BUN: 7 mg/dL (ref 6–24)
Bilirubin Total: 0.3 mg/dL (ref 0.0–1.2)
CO2: 24 mmol/L (ref 20–29)
Calcium: 9.2 mg/dL (ref 8.7–10.2)
Chloride: 103 mmol/L (ref 96–106)
Creatinine, Ser: 0.8 mg/dL (ref 0.57–1.00)
Globulin, Total: 2.7 g/dL (ref 1.5–4.5)
Glucose: 103 mg/dL — ABNORMAL HIGH (ref 70–99)
Potassium: 4.1 mmol/L (ref 3.5–5.2)
Sodium: 141 mmol/L (ref 134–144)
Total Protein: 6.2 g/dL (ref 6.0–8.5)
eGFR: 88 mL/min/1.73 (ref >59)

## 2024-08-24 LAB — LIPID PANEL
Chol/HDL Ratio: 3.8 ratio (ref 0.0–4.4)
Cholesterol, Total: 174 mg/dL (ref 100–199)
HDL: 46 mg/dL (ref >39)
LDL Chol Calc (NIH): 99 mg/dL (ref 0–99)
Triglycerides: 169 mg/dL — ABNORMAL HIGH (ref 0–149)
VLDL Cholesterol Cal: 29 mg/dL (ref 5–40)

## 2024-08-25 ENCOUNTER — Ambulatory Visit: Payer: Self-pay | Admitting: Family

## 2024-09-02 ENCOUNTER — Ambulatory Visit (INDEPENDENT_AMBULATORY_CARE_PROVIDER_SITE_OTHER)

## 2024-09-02 ENCOUNTER — Ambulatory Visit
Admission: EM | Admit: 2024-09-02 | Discharge: 2024-09-02 | Disposition: A | Discharge status: Home / Self Care | Attending: Nurse Practitioner | Admitting: Nurse Practitioner

## 2024-09-02 DIAGNOSIS — S92352A Displaced fracture of fifth metatarsal bone, left foot, initial encounter for closed fracture: Secondary | ICD-10-CM

## 2024-09-02 NOTE — ED Notes (Signed)
 Pt height 5'3. [facility] did not have appropriate size in stock. After assessing if the 4'6-5'2 crutches were not appropriate, I informed the provider of my findings. Pt was unstable on her feet and still trying to apply pressure to broken left foot. States that she has a knee walker at home. Provider stated that a cam boot would suffice instead of a posterior short leg. Cam boot applied. Pt stated this was a much better fit for her. I informed pt that an ortho follow up is needed within the next 3-4 days. Pt verbalized understand.

## 2024-09-02 NOTE — Discharge Instructions (Addendum)
 The x-ray shows you have a fracture in your left foot. Wear the cam boot with any weightbearing. RICE therapy, rest, ice, compression, and elevation.  Apply ice for 20 minutes, remove for 1 hour, repeat as needed to help with pain or swelling. You may take over-the-counter Tylenol as needed for pain or general discomfort. I would like for you to follow-up with orthopedics for further evaluation within the next 3 to 4 days.  I have placed a referral for Ortho care of [place].  Please call to schedule an appointment as soon as possible. Follow-up as needed.

## 2024-09-02 NOTE — ED Provider Notes (Signed)
 [facility]    CSN: [id number] Arrival date & time: 09/02/24  1107      History   Chief Complaint Chief Complaint  Patient presents with   Fall    HPI Kathryn Mitchell is a 53 y.o. female.   The history is provided by the patient.   Patient presents for complaints of left foot pain with bruising and swelling after a fall last evening.  Patient states she was trying to step over a package on her steps, states that she may have twisted the left ankle causing her to fall.  She also reports that she has abrasions to the bilateral knees.  She states that she has had difficulty ambulating.  She denies numbness, tingling, or radiation of pain.  States that she has elevated the left lower extremity and applied ice for her symptoms.  Patient states that she has also taken Tylenol for her symptoms.  Past Medical History:  Diagnosis Date   GAD (generalized anxiety disorder)    GERD (gastroesophageal reflux disease)    HLD (hyperlipidemia)    Hypertension    Panic attacks     Patient Active Problem List   Diagnosis Date Noted   Current smoker 12/02/2022   Panic attacks 05/22/2020   Hyperlipemia 02/26/2018   Hypokalemia 02/26/2018   GAD (generalized anxiety disorder) 02/25/2018   Oral herpes 02/25/2018   Essential hypertension, benign 11/08/2015   GERD (gastroesophageal reflux disease) 10/17/2015    Past Surgical History:  Procedure Laterality Date   Cyst removal lower back     TUBAL LIGATION      OB History   No obstetric history on file.      Home Medications    Prior to Admission medications   Medication Sig Start Date End Date Taking? Authorizing Provider  atorvastatin  (LIPITOR) 20 MG tablet Take 1 tablet (20 mg total) by mouth daily. 08/23/24   Lavell Lye A, FNP  busPIRone  (BUSPAR ) 5 MG tablet Take 1 tablet (5 mg total) by mouth 3 (three) times daily as needed. 08/23/24   Lavell Lye LABOR, FNP  diclofenac  (VOLTAREN ) 75 MG EC tablet Take 1 tablet (75  mg total) by mouth 2 (two) times daily. 08/23/24   Lavell Lye LABOR, FNP  fluticasone  (FLONASE ) 50 MCG/ACT nasal spray Place 2 sprays into both nostrils daily. 03/14/24   Lavell Lye A, FNP  lisinopril  (ZESTRIL ) 20 MG tablet Take 1 tablet (20 mg total) by mouth daily. 08/23/24   Lavell Lye LABOR, FNP  omeprazole  (PRILOSEC) 20 MG capsule Take 1 capsule (20 mg total) by mouth daily. 08/23/24   Lavell Lye LABOR, FNP  PARoxetine  (PAXIL ) 40 MG tablet TAKE 1 TABLET(40 MG) BY MOUTH IN THE MORNING 08/23/24   Lavell Lye LABOR, FNP    Family History Family History  Problem Relation Age of Onset   Cancer Mother    Liver disease Father     Social History Social History   Tobacco Use   Smoking status: Former   Smokeless tobacco: Never  Vaping Use   Vaping status: Never Used     Allergies   Codeine   Review of Systems Review of Systems Per HPI  Physical Exam Triage Vital Signs ED Triage Vitals  Encounter Vitals Group     BP 09/02/24 1153 108/73     Girls Systolic BP Percentile --      Girls Diastolic BP Percentile --      Boys Systolic BP Percentile --  Boys Diastolic BP Percentile --      Pulse Rate 09/02/24 1153 69     Resp 09/02/24 1153 20     Temp 09/02/24 1153 98.6 F (37 C)     Temp Source 09/02/24 1153 Oral     SpO2 09/02/24 1153 90 %     Weight --      Height --      Head Circumference --      Peak Flow --      Pain Score 09/02/24 1152 8     Pain Loc --      Pain Education --      Exclude from Growth Chart --    No data found.  Updated Vital Signs BP 108/73 (BP Location: Right Arm)   Pulse 69   Temp 98.6 F (37 C) (Oral)   Resp 20   LMP 01/01/2017   SpO2 90%   Visual Acuity Right Eye Distance:   Left Eye Distance:   Bilateral Distance:    Right Eye Near:   Left Eye Near:    Bilateral Near:     Physical Exam Vitals and nursing note reviewed.  Constitutional:      General: She is not in acute distress.    Appearance: Normal appearance.   HENT:     Head: Normocephalic.  Eyes:     Extraocular Movements: Extraocular movements intact.     Pupils: Pupils are equal, round, and reactive to light.  Pulmonary:     Effort: Pulmonary effort is normal.  Musculoskeletal:     Cervical back: Normal range of motion.     Left foot: Decreased range of motion. Normal capillary refill. Swelling and tenderness present. No deformity. Normal pulse.     Comments: Swelling and bruising noted to the dorsal aspect of the left foot from the second metatarsal through the fifth metatarsal.  Decreased range of motion of the 2nd through the 5th toes.  +2 DP/PT pulses, neurovascularly intact.  Skin:    General: Skin is warm and dry.  Neurological:     General: No focal deficit present.     Mental Status: She is alert and oriented to person, place, and time.  Psychiatric:        Mood and Affect: Mood normal.        Behavior: Behavior normal.      UC Treatments / Results  Labs (all labs ordered are listed, but only abnormal results are displayed) Labs Reviewed - No data to display  EKG   Radiology DG Foot Complete Left Result Date: 09/02/2024 CLINICAL DATA:  Fall, foot pain EXAM: LEFT FOOT - COMPLETE 3+ VIEW COMPARISON:  None Available. FINDINGS: There is an oblique fracture of the distal third of the fifth metatarsal IMPRESSION: Oblique fracture of the distal third of the fifth metatarsal Electronically Signed   By: Nancyann Burns M.D.   On: 09/02/2024 12:32    Procedures Procedures (including critical care time)  Medications Ordered in UC Medications - No data to display  Initial Impression / Assessment and Plan / UC Course  I have reviewed the triage vital signs and the nursing notes.  Pertinent labs & imaging results that were available during my care of the patient were reviewed by me and considered in my medical decision making (see chart for details).  X-ray of the left is positive for fracture for the distal third of the fifth  metatarsal.  CAM Walker boot was provided as this office did not have  the appropriate height for crutches.  Walker boot to allow for weightbearing as tolerated until patient can be seen by orthopedics for splinting and further evaluation.  Referral was placed for Ortho care at [facility].  Supportive care recommendations were provided and discussed with the patient to include over-the-counter analgesics and RICE therapy.  Patient advised to follow-up with orthopedics within the next 3 to 4 days for further evaluation.  Patient was in agreement with this plan of care and verbalizes understanding.  All questions were answered.  Patient stable for discharge.  Work note was provided.  Final Clinical Impressions(s) / UC Diagnoses   Final diagnoses:  None   Discharge Instructions   None    ED Prescriptions   None    PDMP not reviewed this encounter.   [name], NP 09/02/24 1316

## 2024-09-02 NOTE — ED Triage Notes (Signed)
 Pt reports she fell trying to step over a package x 1 day injuring her left foot.   L foot is swollen and bruised. Took tylenol

## 2024-09-07 ENCOUNTER — Ambulatory Visit: Admitting: Surgical

## 2024-09-07 ENCOUNTER — Encounter: Payer: Self-pay | Admitting: Surgical

## 2024-09-07 ENCOUNTER — Other Ambulatory Visit (INDEPENDENT_AMBULATORY_CARE_PROVIDER_SITE_OTHER): Payer: Self-pay

## 2024-09-07 DIAGNOSIS — M25511 Pain in right shoulder: Secondary | ICD-10-CM | POA: Diagnosis not present

## 2024-09-07 DIAGNOSIS — S92352A Displaced fracture of fifth metatarsal bone, left foot, initial encounter for closed fracture: Secondary | ICD-10-CM | POA: Diagnosis not present

## 2024-09-08 ENCOUNTER — Telehealth: Payer: Self-pay | Admitting: Surgical

## 2024-09-08 ENCOUNTER — Encounter: Payer: Self-pay | Admitting: Surgical

## 2024-09-08 NOTE — Telephone Encounter (Signed)
 Yes, I can call her but not during clinic.may be tomorrow

## 2024-09-08 NOTE — Telephone Encounter (Signed)
 Yes, we do have a cast shoe here that should work

## 2024-09-08 NOTE — Progress Notes (Signed)
 Office Visit Note   Patient: Kathryn Mitchell           Date of Birth: [birth year]           MRN: [id number] Visit Date: 09/07/2024 Requested by: [name], NP [address] [place],  [place] [address] PCP: Lavell Bari LABOR, FNP  Subjective: Chief Complaint  Patient presents with   Foot Injury    Left foot fracture, seen at [facility] on 09/02/24.  Dx w/ oblique fracture of the distal third of the fifth metatarsal. In fx boot, tylenol/advil for pain as needed. None x 1 day.     Shoulder Pain    R shld may have jarred it when she fell, not sure, hx bursitis.  Can't lift R arm.      HPI: Kathryn Mitchell is a 53 y.o. female who presents to the office reporting left foot and right shoulder pain.  Patient states that she fell and fractured her left foot.  Date of injury was on 09/01/2024.  Seen at urgent care on 09/02/2024 with radiographs demonstrating distal to midshaft fifth metatarsal fracture.  She states that she has been ambulating in fracture boot and is having less pain but it still is causing significant discomfort.  Taking Tylenol and Advil which is helping somewhat.  She does consistently smoke cigarettes.  In addition to the foot pain, she notes right shoulder pain.  She fell while carrying a container of kitty litter and did not drop the [employer] so she did not brace her fall with her right shoulder and no FOOSH injury.  She describes pain in the lateral aspect of the right shoulder that will radiate down to the mid humeral region.  She also has pain on the superior aspect of the right shoulder.  No history of prior right shoulder surgery.  She has difficulty lifting it.  She was having some pain in the right shoulder prior to the fall that was intermittently present in the lateral aspect and made worse with activity, such as house chores.  She figured this was bursitis and never had any workup for this.  No numbness or tingling.  No neck pain..                ROS: All systems  reviewed are negative as they relate to the chief complaint within the history of present illness.  Patient denies fevers or chills.  Assessment & Plan: Visit Diagnoses:  1. Closed displaced fracture of fifth metatarsal bone of left foot, initial encounter   2. Acute pain of right shoulder     Plan: Impression is 53 year old female who is here for evaluation of somewhat displaced fifth metatarsal shaft fracture.  It has displacement of about 2 mm.  No definitive surgical indication at this time but with the displacement present and patient's smoking history, there is a somewhat higher risk of nonunion in this case which was discussed with [name].  As a result, we will transition from fracture boot to a walking cast.  We will see her back in 2 weeks for removal of the cast and new radiographs to assess the fracture for any new displacement.  If it displaces further, would refer to Dr. Harden for surgical intervention.  Also, patient notes right shoulder pain that has been chronic for her but substantially worse since her fall to the point where she has difficulty and weakness with lifting her shoulder.  Isolating her rotator cuff strength demonstrates really no  weakness and she has excellent strength on exam today.  She has radiographs taken that do demonstrate some calcium  deposit in the subacromial space which seems more consistent with calcific tendinitis rather than greater tuberosity fracture.  However, a lot of her pain localizes to the distal clavicle but there is no fracture noted on radiographs today.  Plan for MRI of the right shoulder without contrast to evaluate calcium  deposit as well as evaluate for occult clavicle fracture.  Follow-up after MRI to review results.  Until we get MRI, she will stay in sling.  Follow-Up Instructions: No follow-ups on file.   Orders:  Orders Placed This Encounter  Procedures   DG Shoulder Right   MR SHOULDER RIGHT WO CONTRAST   No orders of the defined types  were placed in this encounter.     Procedures: No procedures performed   Clinical Data: No additional findings.  Objective: Vital Signs: LMP 01/01/2017   Physical Exam:  Constitutional: Patient appears well-developed HEENT:  Head: Normocephalic Eyes:EOM are normal Neck: Normal range of motion Cardiovascular: Normal rate Pulmonary/chest: Effort normal Neurologic: Patient is alert Skin: Skin is warm Psychiatric: Patient has normal mood and affect  Ortho Exam: Ortho exam demonstrates right shoulder with 60 degrees X rotation, 90 degrees abduction, 160 degrees were elevation passively and actively.  Excellent rotator cuff strength of supra, infra, subscap rated 5/5.  She has palpable radial pulse rated 2+.  Intact EPL, FPL, finger abduction, pronation/supination, bicep, tricep, deltoid.  She has no deformity noted to the right shoulder.  There is no ecchymosis.  Active motion equivalent to passive motion.  She does have mild to moderate tenderness over the greater tuberosity laterally and moderate to severe tenderness over the [facility] joint and especially the distal clavicle.  Regarding her left foot, she has palpable pedal pulses of the left lower extremity.  She has exquisite tenderness over the midshaft of the fifth metatarsal.  Intact EHL, dorsiflexion, plantarflexion, inversion, eversion.  No tenderness over the medial, lateral malleoli.  No tenderness or deformity noted over the Achilles tendon.  No tenderness over the Lisfranc complex and there is no plantar ecchymosis noted.  There is a little bit of ecchymosis on the lateral aspect of the foot dorsally near the fracture site.  Specialty Comments:  No specialty comments available.  Imaging: No results found.   PMFS History: Patient Active Problem List   Diagnosis Date Noted   Current smoker 12/02/2022   Panic attacks 05/22/2020   Hyperlipemia 02/26/2018   Hypokalemia 02/26/2018   GAD (generalized anxiety disorder)  02/25/2018   Oral herpes 02/25/2018   Essential hypertension, benign 11/08/2015   GERD (gastroesophageal reflux disease) 10/17/2015   Past Medical History:  Diagnosis Date   GAD (generalized anxiety disorder)    GERD (gastroesophageal reflux disease)    HLD (hyperlipidemia)    Hypertension    Panic attacks     Family History  Problem Relation Age of Onset   Cancer Mother    Liver disease Father     Past Surgical History:  Procedure Laterality Date   Cyst removal lower back     TUBAL LIGATION     Social History   Occupational History   Not on file  Tobacco Use   Smoking status: Former   Smokeless tobacco: Never  Vaping Use   Vaping status: Never Used  Substance and Sexual Activity   Alcohol use: Not on file   Drug use: Not on file   Sexual activity: Not  on file

## 2024-09-08 NOTE — Telephone Encounter (Signed)
 Pt was seen yesterday by Herlene and Rhoda was talking to her about a shoe. Said she had one in our office and could get it to the [employer] pt hasn't heard anything back. Pt call back number is [phone]

## 2024-09-08 NOTE — Telephone Encounter (Signed)
 We should, let me check

## 2024-09-09 NOTE — Telephone Encounter (Signed)
 Per Sari HERO did not need to sign the tablet for the cast shoe. Please see her if any questions

## 2024-09-09 NOTE — Telephone Encounter (Signed)
 Cast shoe is upfront- Hope is aware pt husband coming to pick up

## 2024-09-11 ENCOUNTER — Ambulatory Visit (HOSPITAL_COMMUNITY)

## 2024-09-14 ENCOUNTER — Encounter: Payer: Self-pay | Admitting: Surgical

## 2024-09-14 ENCOUNTER — Ambulatory Visit (INDEPENDENT_AMBULATORY_CARE_PROVIDER_SITE_OTHER): Admitting: Surgical

## 2024-09-14 ENCOUNTER — Ambulatory Visit: Admitting: Surgical

## 2024-09-14 ENCOUNTER — Other Ambulatory Visit: Payer: Self-pay

## 2024-09-14 DIAGNOSIS — S92352A Displaced fracture of fifth metatarsal bone, left foot, initial encounter for closed fracture: Secondary | ICD-10-CM

## 2024-09-14 NOTE — Progress Notes (Signed)
 Post-fracture visit Note   Patient: Kathryn Mitchell           Date of Birth: [birth year]           MRN: [id number] Visit Date: 09/14/2024 PCP: Lavell Bari LABOR, FNP   Assessment & Plan:  Chief Complaint:  Chief Complaint  Patient presents with   left foot pain    Cast problem   Visit Diagnoses:  1. Closed displaced fracture of fifth metatarsal bone of left foot, initial encounter     Plan: Patient returns for cast removal and reevaluation with x-ray.  She is now about 12 to 13 days out from fall resulting in distal fifth metatarsal shaft fracture.  She also had complaint of right shoulder pain that was bothering her more than her foot fracture but she states that the shoulder pain that she was having has all but resolved with 95% improvement and she is really having no significant discomfort in her shoulder.  She has canceled the MRI which I think is reasonable.  On examination of the shoulder, she has no residual tenderness over the [facility] joint or the distal clavicle and no pain with range of motion of the shoulder.  Excellent rotator cuff strength of supra, infra, subscap.  Axillary nerve intact with deltoid firing.  Plan for no intervention for her shoulder pain at this point.  Regarding the foot, she has been ambulatory in walking cast.  This has been causing some burning pain under her toes in the last few days and this was removed with resolution of that burning pain.  She has new radiographs taken today demonstrating displacement of the fracture with about 3.8 mm of displacement at this point, progressed from 2 mm from previous radiographs.  With her smoking history and with the amount of displacement present now, I think the best option for [name] is to follow-up with Dr. Harden in the next few days for evaluation and consideration of metatarsal shaft fracture ORIF.  Patient agreed with plan.  In the meantime she will weight-bear as she can tolerate in fracture boot primarily putting weight  through her heel if she can manage this.  She has been trying to limit the amount that she smokes as much as she can.    Follow-Up Instructions: No follow-ups on file.   Orders:  Orders Placed This Encounter  Procedures   DG Foot Complete Left   No orders of the defined types were placed in this encounter.   Imaging: No results found.  PMFS History: Patient Active Problem List   Diagnosis Date Noted   Current smoker 12/02/2022   Panic attacks 05/22/2020   Hyperlipemia 02/26/2018   Hypokalemia 02/26/2018   GAD (generalized anxiety disorder) 02/25/2018   Oral herpes 02/25/2018   Essential hypertension, benign 11/08/2015   GERD (gastroesophageal reflux disease) 10/17/2015   Past Medical History:  Diagnosis Date   GAD (generalized anxiety disorder)    GERD (gastroesophageal reflux disease)    HLD (hyperlipidemia)    Hypertension    Panic attacks     Family History  Problem Relation Age of Onset   Cancer Mother    Liver disease Father     Past Surgical History:  Procedure Laterality Date   Cyst removal lower back     TUBAL LIGATION     Social History   Occupational History   Not on file  Tobacco Use   Smoking status: Former   Smokeless tobacco: Never  Advertising account planner  Vaping status: Never Used  Substance and Sexual Activity   Alcohol use: Not on file   Drug use: Not on file   Sexual activity: Not on file

## 2024-09-15 ENCOUNTER — Ambulatory Visit: Admitting: Orthopedic Surgery

## 2024-09-15 DIAGNOSIS — S92352A Displaced fracture of fifth metatarsal bone, left foot, initial encounter for closed fracture: Secondary | ICD-10-CM

## 2024-09-19 ENCOUNTER — Encounter: Payer: Self-pay | Admitting: Orthopedic Surgery

## 2024-09-19 NOTE — Progress Notes (Signed)
 This 53 year old lady is seen today here she said that her symptoms backwards probably on her s  Office Visit Note   Patient: Kathryn Mitchell           Date of Birth: [birth year]           MRN: [id number] Visit Date: 09/15/2024              Requested by: Lavell Bari LABOR, FNP [address] [place],  [place] [address] PCP: Lavell Bari LABOR, FNP  Chief Complaint  Patient presents with   Left Foot - Fracture      HPI: Discussed the use of AI scribe software for clinical note transcription with the patient, who gave verbal consent to proceed.  History of Present Illness Kathryn Mitchell is a 53 year old female who presents for initial evaluation of a segmental fracture of the shaft of the fifth metatarsal.  She is currently wearing a fracture boot for a segmental fracture of the shaft of the fifth metatarsal on her left foot. Radiographs show a straight alignment and some shortening of the fifth metatarsal with a slight 'zigzag' deformity in the mid shaft.  She is not taking vitamin D3 supplements regularly. She previously took 5000 IU daily and currently takes 1000 IU daily.  She smokes and is trying to cut back. She works from home and has been set up in a conference room where she can elevate her foot under the table.  She is concerned about removing the boot while sleeping, fearing that the bone might protrude.     Assessment & Plan: Visit Diagnoses:  1. Closed displaced fracture of fifth metatarsal bone of left foot, initial encounter     Plan: Assessment and Plan Assessment & Plan Displaced segmental fracture of shaft of fifth metatarsal, left foot Radiographs showed a segmental fracture with straight alignment and slight shortening, no dorsal or plantar bony prominence. Slight Z deformity in mid shaft expected to heal without surgery. Circulation adequate with palpable dorsalis pedis pulse. - Advised smoking cessation to promote healing. - Continue fracture boot  when ambulating; remove when sitting or sleeping unless ambulating at night. - Administer 2000-3000 IU of vitamin D3 daily. - Apply ACE bandage for compression to enhance circulation. - Follow up in four weeks with three-view radiographs of the left foot.      Follow-Up Instructions: Return in about 4 weeks (around 10/13/2024).   Ortho Exam  Patient is alert, oriented, no adenopathy, well-dressed, normal affect, normal respiratory effort. Physical Exam CARDIOVASCULAR: Palpable dorsalis pedis pulse. EXTREMITIES: No ecchymosis or bruising on left foot. No angular deformity to the left little toe.      Imaging: No results found. No images are attached to the encounter.  Labs: No results found for: HGBA1C, ESRSEDRATE, CRP, LABURIC, REPTSTATUS, GRAMSTAIN, CULT, LABORGA   Lab Results  Component Value Date   ALBUMIN 3.5 (L) 08/23/2024   ALBUMIN 3.8 01/20/2024   ALBUMIN 3.9 12/04/2023    No results found for: MG Lab Results  Component Value Date   VD25OH 34.3 02/25/2018    No results found for: PREALBUMIN    Latest Ref Rng & Units 12/04/2023    8:30 AM 06/04/2023    8:44 AM 12/02/2022   10:03 AM  CBC EXTENDED  WBC 3.4 - 10.8 x10E3/uL 7.3  6.4  6.9   RBC 3.77 - 5.28 x10E6/uL 5.25  5.14  5.06   Hemoglobin 11.1 - 15.9 g/dL 84.4  84.5  14.8  HCT 34.0 - 46.6 % 46.4  45.1  44.1   Platelets 150 - 450 x10E3/uL 262  259  256   NEUT# 1.4 - 7.0 x10E3/uL 4.8  4.2  4.7   Lymph# 0.7 - 3.1 x10E3/uL 2.0  1.9  1.6      There is no height or weight on file to calculate BMI.  Orders:  No orders of the defined types were placed in this encounter.  No orders of the defined types were placed in this encounter.    Procedures: No procedures performed  Clinical Data: No additional findings.  ROS:  All other systems negative, except as noted in the HPI. Review of Systems  Objective: Vital Signs: LMP 01/01/2017   Specialty Comments:  No specialty  comments available.  PMFS History: Patient Active Problem List   Diagnosis Date Noted   Current smoker 12/02/2022   Panic attacks 05/22/2020   Hyperlipemia 02/26/2018   Hypokalemia 02/26/2018   GAD (generalized anxiety disorder) 02/25/2018   Oral herpes 02/25/2018   Essential hypertension, benign 11/08/2015   GERD (gastroesophageal reflux disease) 10/17/2015   Past Medical History:  Diagnosis Date   GAD (generalized anxiety disorder)    GERD (gastroesophageal reflux disease)    HLD (hyperlipidemia)    Hypertension    Panic attacks     Family History  Problem Relation Age of Onset   Cancer Mother    Liver disease Father     Past Surgical History:  Procedure Laterality Date   Cyst removal lower back     TUBAL LIGATION     Social History   Occupational History   Not on file  Tobacco Use   Smoking status: Former   Smokeless tobacco: Never  Vaping Use   Vaping status: Never Used  Substance and Sexual Activity   Alcohol use: Not on file   Drug use: Not on file   Sexual activity: Not on file

## 2024-09-21 ENCOUNTER — Ambulatory Visit: Admitting: Surgical

## 2024-09-26 ENCOUNTER — Encounter: Payer: Self-pay | Admitting: Radiology

## 2024-10-10 ENCOUNTER — Other Ambulatory Visit (INDEPENDENT_AMBULATORY_CARE_PROVIDER_SITE_OTHER): Payer: Self-pay

## 2024-10-10 ENCOUNTER — Encounter: Payer: Self-pay | Admitting: Orthopedic Surgery

## 2024-10-10 ENCOUNTER — Ambulatory Visit: Admitting: Orthopedic Surgery

## 2024-10-10 DIAGNOSIS — S92352A Displaced fracture of fifth metatarsal bone, left foot, initial encounter for closed fracture: Secondary | ICD-10-CM

## 2024-10-10 NOTE — Progress Notes (Signed)
 Office Visit Note   Patient: Kathryn Mitchell           Date of Birth: May 04, 1971           MRN: [id number] Visit Date: 10/10/2024              Requested by: Lavell Bari LABOR, FNP [address] [place],  [place] [address] PCP: Lavell Bari LABOR, FNP  Chief Complaint  Patient presents with   Left Foot - Fracture, Follow-up      HPI: Discussed the use of AI scribe software for clinical note transcription with the patient, who gave verbal consent to proceed.  History of Present Illness Kathryn Mitchell is a 53 year old female who presents for follow-up of a left foot fracture.  She sustained a left foot fracture on October 9th, approximately six weeks ago, after a fall. Since the injury, she has been using a fracture boot for support. She reports some soreness at the fracture site. The pain has decreased compared to the initial weeks following the injury.  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 No pain in the back of the foot, but there is soreness at the fracture site. She experiences swelling and a sensation of pins and needles in the foot.  She uses a shower chair to avoid standing in the shower. She has difficulty sleeping, as she sometimes wakes up trying to move her foot.     Assessment & Plan: Visit Diagnoses:  1. Closed displaced fracture of fifth metatarsal bone of left foot, initial encounter     Plan: Assessment and Plan Assessment & Plan Displaced fracture of left fifth metatarsal bone Fracture healing with callous formation. Minimal pain with distraction. Swelling causing nerve irritation. Full healing expected in four weeks. - Continue fracture boot or transition to stiff-soled sneaker. - Avoid walking barefoot; wear supportive footwear at all times. - Follow up in four weeks with repeat three-view radiographs of  the left foot. - Plan for discharge after follow-up if healing is satisfactory.      Follow-Up Instructions: No follow-ups on file.   Ortho Exam  Patient is alert, oriented, no adenopathy, well-dressed, normal affect, normal respiratory effort. Physical Exam CARDIOVASCULAR: Dorsal pedal pulse palpable. MUSCULOSKELETAL: Minimal pain with distraction across fracture site.      Imaging: XR Foot Complete Left Result Date: 10/10/2024 Radiographs of the left foot shows interval callus formation at the metatarsal shaft fracture  No images are attached to the encounter.  Labs: No results found for: HGBA1C, ESRSEDRATE, CRP, LABURIC, REPTSTATUS, GRAMSTAIN, CULT, LABORGA   Lab Results  Component Value Date   ALBUMIN 3.5 (L) 08/23/2024   ALBUMIN 3.8 01/20/2024   ALBUMIN 3.9 12/04/2023    No results found for: MG Lab Results  Component Value Date   VD25OH 34.3 02/25/2018    No results found for: PREALBUMIN    Latest Ref Rng & Units 12/04/2023    8:30 AM 06/04/2023    8:44 AM 12/02/2022   10:03 AM  CBC EXTENDED  WBC 3.4 - 10.8 x10E3/uL 7.3  6.4  6.9   RBC 3.77 - 5.28 x10E6/uL 5.25  5.14  5.06   Hemoglobin 11.1 - 15.9 g/dL 84.4  84.5  85.1   HCT 34.0 - 46.6 % 46.4  45.1  44.1   Platelets 150 - 450  x10E3/uL 262  259  256   NEUT# 1.4 - 7.0 x10E3/uL 4.8  4.2  4.7   Lymph# 0.7 - 3.1 x10E3/uL 2.0  1.9  1.6      There is no height or weight on file to calculate BMI.  Orders:  Orders Placed This Encounter  Procedures   XR Foot Complete Left   No orders of the defined types were placed in this encounter.    Procedures: No procedures performed  Clinical Data: No additional findings.  ROS:  All other systems negative, except as noted in the HPI. Review of Systems  Objective: Vital Signs: LMP 01/01/2017   Specialty Comments:  No specialty comments available.  PMFS History: Patient Active Problem List   Diagnosis Date Noted   Current  smoker 12/02/2022   Panic attacks 05/22/2020   Hyperlipemia 02/26/2018   Hypokalemia 02/26/2018   GAD (generalized anxiety disorder) 02/25/2018   Oral herpes 02/25/2018   Essential hypertension, benign 11/08/2015   GERD (gastroesophageal reflux disease) 10/17/2015   Past Medical History:  Diagnosis Date   GAD (generalized anxiety disorder)    GERD (gastroesophageal reflux disease)    HLD (hyperlipidemia)    Hypertension    Panic attacks     Family History  Problem Relation Age of Onset   Cancer Mother    Liver disease Father     Past Surgical History:  Procedure Laterality Date   Cyst removal lower back     TUBAL LIGATION     Social History   Occupational History   Not on file  Tobacco Use   Smoking status: Former   Smokeless tobacco: Never  Vaping Use   Vaping status: Never Used  Substance and Sexual Activity   Alcohol use: Not on file   Drug use: Not on file   Sexual activity: Not on file

## 2024-10-13 ENCOUNTER — Ambulatory Visit: Admitting: Orthopedic Surgery

## 2024-10-24 ENCOUNTER — Ambulatory Visit: Admitting: Orthopedic Surgery

## 2024-11-07 ENCOUNTER — Other Ambulatory Visit: Payer: Self-pay

## 2024-11-07 ENCOUNTER — Ambulatory Visit: Admitting: Orthopedic Surgery

## 2024-11-07 DIAGNOSIS — S92352A Displaced fracture of fifth metatarsal bone, left foot, initial encounter for closed fracture: Secondary | ICD-10-CM

## 2024-11-08 ENCOUNTER — Encounter: Payer: Self-pay | Admitting: Orthopedic Surgery

## 2024-11-08 NOTE — Progress Notes (Signed)
 Office Visit Note   Patient: Kathryn Mitchell           Date of Birth: Apr 27, 1971           MRN: [id number] Visit Date: 11/07/2024              Requested by: Lavell Bari LABOR, FNP [id number] [place],  [place] [address] PCP: Lavell Bari LABOR, FNP  Chief Complaint  Patient presents with   Left Foot - Fracture, Follow-up      HPI: Discussed the use of AI scribe software for clinical note transcription with the patient, who gave verbal consent to proceed.  History of Present Illness Kathryn Mitchell is a 53 year old female who presents with numbness and tingling in the foot after a fracture.  She experiences tingling and numbness in her foot, particularly at night. The sensation is described as 'tingly, numb, I guess where I can't go.'  She has been wearing a hard boot, which has contributed to the numbness and tingling, especially by the end of the day. Sleeping in the boot has aggravated the symptoms.  She uses foot powder and reports that the foot feels a little sore but generally good. She takes the boot off except when showering, where she uses a shower chair.  She prefers wearing flip flops or soft boots at home but acknowledges that these are not suitable for her current condition.     Assessment & Plan: Visit Diagnoses:  1. Closed displaced fracture of fifth metatarsal bone of left foot, initial encounter     Plan: Assessment and Plan Assessment & Plan Closed displaced fracture of fifth metatarsal bone, left foot Fracture healed as confirmed by x-ray. Numbness and tingling likely from prolonged hard boot use. - Transition to stiff sneaker to alleviate foot pressure. - Avoid flip flops or going barefoot. - Allow standing during showers, ensure no slipping. - No footwear needed while sleeping.      Follow-Up Instructions: Return if symptoms worsen or fail to improve.   Ortho Exam  Patient is alert, oriented, no adenopathy, well-dressed, normal  affect, normal respiratory effort. Physical Exam MUSCULOSKELETAL: Fracture site not tender to palpation, no swelling.   Examination of the left foot there is no swelling no cellulitis no open wounds.  Patient has subjective numbness in her toes most likely secondary to the pressure from the fracture boot.   Imaging: No results found. No images are attached to the encounter.  Labs: No results found for: HGBA1C, ESRSEDRATE, CRP, LABURIC, REPTSTATUS, GRAMSTAIN, CULT, LABORGA   Lab Results  Component Value Date   ALBUMIN 3.5 (L) 08/23/2024   ALBUMIN 3.8 01/20/2024   ALBUMIN 3.9 12/04/2023    No results found for: MG Lab Results  Component Value Date   VD25OH 34.3 02/25/2018    No results found for: PREALBUMIN    Latest Ref Rng & Units 12/04/2023    8:30 AM 06/04/2023    8:44 AM 12/02/2022   10:03 AM  CBC EXTENDED  WBC 3.4 - 10.8 x10E3/uL 7.3  6.4  6.9   RBC 3.77 - 5.28 x10E6/uL 5.25  5.14  5.06   Hemoglobin 11.1 - 15.9 g/dL 84.4  84.5  85.1   HCT 34.0 - 46.6 % 46.4  45.1  44.1   Platelets 150 - 450 x10E3/uL 262  259  256   NEUT# 1.4 - 7.0 x10E3/uL 4.8  4.2  4.7   Lymph# 0.7 - 3.1 x10E3/uL 2.0  1.9  1.6      There is no height or weight on file to calculate BMI.  Orders:  Orders Placed This Encounter  Procedures   XR Foot Complete Left   No orders of the defined types were placed in this encounter.    Procedures: No procedures performed  Clinical Data: No additional findings.  ROS:  All other systems negative, except as noted in the HPI. Review of Systems  Objective: Vital Signs: LMP 01/01/2017   Specialty Comments:  No specialty comments available.  PMFS History: Patient Active Problem List   Diagnosis Date Noted   Current smoker 12/02/2022   Panic attacks 05/22/2020   Hyperlipemia 02/26/2018   Hypokalemia 02/26/2018   GAD (generalized anxiety disorder) 02/25/2018   Oral herpes 02/25/2018   Essential hypertension, benign  11/08/2015   GERD (gastroesophageal reflux disease) 10/17/2015   Past Medical History:  Diagnosis Date   GAD (generalized anxiety disorder)    GERD (gastroesophageal reflux disease)    HLD (hyperlipidemia)    Hypertension    Panic attacks     Family History  Problem Relation Age of Onset   Cancer Mother    Liver disease Father     Past Surgical History:  Procedure Laterality Date   Cyst removal lower back     TUBAL LIGATION     Social History   Occupational History   Not on file  Tobacco Use   Smoking status: Former   Smokeless tobacco: Never  Vaping Use   Vaping status: Never Used  Substance and Sexual Activity   Alcohol use: Not on file   Drug use: Not on file   Sexual activity: Not on file

## 2025-02-20 ENCOUNTER — Ambulatory Visit: Payer: Self-pay | Admitting: Family
